# Patient Record
Sex: Male | Born: 1975
Health system: Southern US, Community
[De-identification: ages and names within clinical notes are randomized; demographics above are authoritative.]

## PROBLEM LIST (undated history)

## (undated) ENCOUNTER — Ambulatory Visit: Admission: EM | Payer: BC Managed Care – PPO | Source: Home / Self Care

## (undated) DIAGNOSIS — I10 Essential (primary) hypertension: Secondary | ICD-10-CM

## (undated) HISTORY — PX: EYE SURGERY: SHX253

---

## 2004-10-14 ENCOUNTER — Ambulatory Visit: Payer: Self-pay | Admitting: Family Medicine

## 2005-01-19 ENCOUNTER — Ambulatory Visit: Payer: Self-pay | Admitting: Family Medicine

## 2005-08-26 ENCOUNTER — Ambulatory Visit: Payer: Self-pay | Admitting: Family Medicine

## 2005-10-25 DIAGNOSIS — J309 Allergic rhinitis, unspecified: Secondary | ICD-10-CM | POA: Insufficient documentation

## 2006-03-20 ENCOUNTER — Ambulatory Visit: Payer: Self-pay | Admitting: Family Medicine

## 2006-03-20 DIAGNOSIS — K219 Gastro-esophageal reflux disease without esophagitis: Secondary | ICD-10-CM

## 2006-04-06 ENCOUNTER — Encounter: Payer: Self-pay | Admitting: Family Medicine

## 2006-04-21 ENCOUNTER — Ambulatory Visit: Payer: Self-pay | Admitting: Family Medicine

## 2006-04-21 ENCOUNTER — Telehealth (INDEPENDENT_AMBULATORY_CARE_PROVIDER_SITE_OTHER): Payer: Self-pay | Admitting: *Deleted

## 2008-06-11 ENCOUNTER — Encounter: Payer: Self-pay | Admitting: Family Medicine

## 2008-08-22 ENCOUNTER — Ambulatory Visit: Payer: Self-pay | Admitting: Family Medicine

## 2008-08-22 DIAGNOSIS — I10 Essential (primary) hypertension: Secondary | ICD-10-CM

## 2008-08-22 DIAGNOSIS — R0789 Other chest pain: Secondary | ICD-10-CM | POA: Insufficient documentation

## 2008-08-25 ENCOUNTER — Encounter: Payer: Self-pay | Admitting: Family Medicine

## 2008-08-25 LAB — CONVERTED CEMR LAB
Alkaline Phosphatase: 95 units/L (ref 39–117)
BUN: 15 mg/dL (ref 6–23)
Basophils Absolute: 0 10*3/uL (ref 0.0–0.1)
Basophils Relative: 1 % (ref 0–1)
Cholesterol: 186 mg/dL (ref 0–200)
Creatinine, Ser: 1.18 mg/dL (ref 0.40–1.50)
Eosinophils Absolute: 0.2 10*3/uL (ref 0.0–0.7)
Eosinophils Relative: 3 % (ref 0–5)
Glucose, Bld: 92 mg/dL (ref 70–99)
HDL: 36 mg/dL — ABNORMAL LOW (ref >39)
Hemoglobin: 14.7 g/dL (ref 13.0–17.0)
LDL Cholesterol: 133 mg/dL — ABNORMAL HIGH (ref 0–99)
MCHC: 33.1 g/dL (ref 30.0–36.0)
MCV: 89 fL (ref 78.0–100.0)
Monocytes Absolute: 0.6 10*3/uL (ref 0.1–1.0)
Neutro Abs: 1.9 10*3/uL (ref 1.7–7.7)
RDW: 13.8 % (ref 11.5–15.5)
Sodium: 143 meq/L (ref 135–145)
Total Bilirubin: 0.4 mg/dL (ref 0.3–1.2)
Total CHOL/HDL Ratio: 5.2
Triglycerides: 87 mg/dL (ref <150)
VLDL: 17 mg/dL (ref 0–40)

## 2009-01-23 ENCOUNTER — Telehealth (INDEPENDENT_AMBULATORY_CARE_PROVIDER_SITE_OTHER): Payer: Self-pay | Admitting: *Deleted

## 2009-01-23 ENCOUNTER — Ambulatory Visit: Payer: Self-pay | Admitting: Family Medicine

## 2009-01-23 ENCOUNTER — Telehealth: Payer: Self-pay | Admitting: Family Medicine

## 2009-01-23 DIAGNOSIS — R369 Urethral discharge, unspecified: Secondary | ICD-10-CM | POA: Insufficient documentation

## 2010-02-16 NOTE — Progress Notes (Signed)
Summary: BP med  Phone Note Call from Patient Call back at Home Phone [phone]   Caller: Patient Call For: Nani Gasser MD Summary of Call: Pt called back and is ok with adding the additional BP med. Send to [facility] in [place]. [place] Initial call taken by: Kathlene November,  January 23, 2009 11:31 AM

## 2010-02-16 NOTE — Assessment & Plan Note (Signed)
Summary: STD TEST, HTN   Vital Signs:  Patient profile:   35 year old male Weight:      291 pounds Pulse rate:   86 / minute BP sitting:   158 / 94  (left arm) Cuff size:   large  Vitals Entered By: Kathlene November (January 23, 2009 9:45 AM) CC: pt needs tested and treated for trich- partner tested positive and was treated yesterday   Primary Care Provider:  Linford Arnold, C  CC:  pt needs tested and treated for trich- partner tested positive and was treated yesterday.  History of Present Illness: pt needs tested and treated for trich- partner tested positive and was treated yesterday.  Having some irritation from urination intermittantly.  ONe had seen blood in his urine. SHe was negative for gonorrhea and chlamydia.  Has had a neg syphillis, RPR  screen this year.    Current Medications (verified): 1)  Prilosec 40 Mg Cpdr (Omeprazole) .... Take 1 Tablet By Mouth Once A Day 2)  Metoprolol Succinate 50 Mg Xr24h-Tab (Metoprolol Succinate) .... Take 1 Tablet By Mouth Once A Day in The Am.  Allergies (verified): No Known Drug Allergies  Comments:  Nurse/Medical Assistant: The patient's medications and allergies were reviewed with the patient and were updated in the Medication and Allergy Lists. Kathlene November (January 23, 2009 9:46 AM)  Physical Exam  General:  Well-developed,well-nourished,in no acute distress; alert,appropriate and cooperative throughout examination Head:  Normocephalic and atraumatic without obvious abnormalities. No apparent alopecia or balding.   Impression & Recommendations:  Problem # 1:  PENILE DISCHARGE (ICD-788.7) Will go ahead and treat for tric since he is symptomatic and we are unable to do a wet prep here in the office. I do strongly recommend GC/chlam testing as well. Cup and order slip given today for first AM urine.  Also he says he has already had HIV and syphillis testing this year.  Call if symptoms don't resolve on teh 2g of metronidazole.    Orders: T-Chlamydia & GC Probe, Urine ([id number])  Problem # 2:  HYPERTENSION, BENIGN (ICD-401.1)  Still not wel controlled. Will add ace/diuretic combon. FU in one month.  His updated medication list for this problem includes:    Metoprolol Succinate 50 Mg Xr24h-tab (Metoprolol succinate) .Marland Kitchen... Take 1 tablet by mouth once a day in the am.    Lisinopril-hydrochlorothiazide 10-12.5 Mg Tabs (Lisinopril-hydrochlorothiazide) .Marland Kitchen... Take 1 tablet by mouth once a day in the am  BP today: 158/94 Prior BP: 155/100 (08/22/2008)  Prior 10 Yr Risk Heart Disease: 7 % (08/25/2008)  Labs Reviewed: K+: 4.3 (08/22/2008) Creat: : 1.18 (08/22/2008)   Chol: 186 (08/22/2008)   HDL: 36 (08/22/2008)   LDL: 133 (08/22/2008)   TG: 87 (08/22/2008)  Complete Medication List: 1)  Prilosec 40 Mg Cpdr (Omeprazole) .... Take 1 tablet by mouth once a day 2)  Metoprolol Succinate 50 Mg Xr24h-tab (Metoprolol succinate) .... Take 1 tablet by mouth once a day in the am. 3)  Metronidazole 500 Mg Tabs (Metronidazole) .... 4 tabs by mouth x 1 4)  Lisinopril-hydrochlorothiazide 10-12.5 Mg Tabs (Lisinopril-hydrochlorothiazide) .... Take 1 tablet by mouth once a day in the am Prescriptions: LISINOPRIL-HYDROCHLOROTHIAZIDE 10-12.5 MG TABS (LISINOPRIL-HYDROCHLOROTHIAZIDE) Take 1 tablet by mouth once a day in the AM  #30 x 1   Entered and Authorized by:   Nani Gasser MD   Signed by:   Nani Gasser MD on 01/23/2009   Method used:   [facility] to  [facility] [phone][facility] (retail)       [address]       [place], [place]  [address]       Ph: [phone] or [phone]       Fax: [phone]   RxID:   [id number] METRONIDAZOLE 500 MG TABS (METRONIDAZOLE) 4 tabs by mouth x 1  #4 x 0   Entered and Authorized by:   Nani Gasser MD   Signed by:   Nani Gasser MD on 01/23/2009   Method used:   Electronically to        [facility] [phone]* (retail)       [address]       [place], [place]  [address]       Ph: [phone] or [phone]       Fax: [phone]   RxID:   [id number]

## 2010-02-16 NOTE — Progress Notes (Signed)
Summary: Med intolerance  Phone Note Call from Patient Call back at Home Phone [phone]   Caller: Patient Call For: Nani Gasser MD Summary of Call: pt called and says he threw the med up that he was prescribed this morning and told to call back if it happened Initial call taken by: Kathlene November,  January 23, 2009 2:24 PM  Follow-up for Phone Call        OK new rx sent at lower dose. just has to take a little longer.  Follow-up by: Nani Gasser MD,  January 23, 2009 3:58 PM  Additional Follow-up for Phone Call Additional follow up Details #1::        Pt notified med sent to pharmacy. Additional Follow-up by: Kathlene November,  January 23, 2009 4:02 PM    New/Updated Medications: METRONIDAZOLE 500 MG TABS (METRONIDAZOLE) Take 1 tablet by mouth two times a day for 7 days Prescriptions: METRONIDAZOLE 500 MG TABS (METRONIDAZOLE) Take 1 tablet by mouth two times a day for 7 days  #14 x 0   Entered and Authorized by:   Nani Gasser MD   Signed by:   Nani Gasser MD on 01/23/2009   Method used:   Electronically to        [facility] [phone]* (retail)       [address]       [place], [place]  [address]       Ph: [phone] or [phone]       Fax: [phone]   RxID:   [id number]

## 2010-02-16 NOTE — Progress Notes (Signed)
----   Converted from flag ---- ---- 01/23/2009 11:05 AM, Nani Gasser MD wrote: Call pt: BP was still high today. Really need to  add another medication for BP control in addition to his metoprolol. Is he OK with this. Then fu in 1 month for BP. ------------------------------  01/23/2009 @ 11:20am-Pt informed of above via VM and instructed to call back if ok and we would call into pharmacy. KJ LPN

## 2011-07-09 ENCOUNTER — Encounter (HOSPITAL_BASED_OUTPATIENT_CLINIC_OR_DEPARTMENT_OTHER): Payer: Self-pay | Admitting: *Deleted

## 2011-07-09 ENCOUNTER — Emergency Department (HOSPITAL_BASED_OUTPATIENT_CLINIC_OR_DEPARTMENT_OTHER)
Admission: EM | Admit: 2011-07-09 | Discharge: 2011-07-09 | Disposition: A | Discharge status: Home / Self Care | Payer: Self-pay | Attending: Emergency Medicine | Admitting: Emergency Medicine

## 2011-07-09 DIAGNOSIS — L02419 Cutaneous abscess of limb, unspecified: Secondary | ICD-10-CM | POA: Insufficient documentation

## 2011-07-09 DIAGNOSIS — Z23 Encounter for immunization: Secondary | ICD-10-CM | POA: Insufficient documentation

## 2011-07-09 DIAGNOSIS — L0291 Cutaneous abscess, unspecified: Secondary | ICD-10-CM

## 2011-07-09 MED ORDER — SULFAMETHOXAZOLE-TRIMETHOPRIM 800-160 MG PO TABS: 1.0000 | ORAL_TABLET | Freq: Two times a day (BID) | ORAL | Status: AC

## 2011-07-09 MED ORDER — TETANUS-DIPHTH-ACELL PERTUSSIS 5-2.5-18.5 LF-MCG/0.5 IM SUSP
0.5000 mL | Freq: Once | INTRAMUSCULAR | Status: AC
  Administered 2011-07-09: 0.5 mL via INTRAMUSCULAR
  Filled 2011-07-09: qty 0.5

## 2011-07-09 MED ORDER — CEPHALEXIN 500 MG PO CAPS: 500.0000 mg | ORAL_CAPSULE | Freq: Four times a day (QID) | ORAL | Status: AC

## 2011-07-09 MED ORDER — SULFAMETHOXAZOLE-TMP DS 800-160 MG PO TABS
1.0000 | ORAL_TABLET | Freq: Once | ORAL | Status: AC
  Administered 2011-07-09: 1 via ORAL
  Filled 2011-07-09: qty 1

## 2011-07-09 MED ORDER — LIDOCAINE HCL 2 % IJ SOLN
INTRAMUSCULAR | Status: AC
  Administered 2011-07-09: 22:00:00
  Filled 2011-07-09: qty 1

## 2011-07-09 MED ORDER — CEPHALEXIN 250 MG PO CAPS
500.0000 mg | ORAL_CAPSULE | Freq: Once | ORAL | Status: AC
  Administered 2011-07-09: 500 mg via ORAL
  Filled 2011-07-09: qty 2

## 2011-07-09 NOTE — Discharge Instructions (Signed)
Cellulitis Cellulitis is an infection of the tissue under the skin. The infected area is usually red and tender. This is caused by germs. These germs enter the body through cuts or sores. This usually happens in the arms or lower legs. HOME CARE   Take your medicine as told. Finish it even if you start to feel better.   If the infection is on the arm or leg, keep it raised (elevated).   Use a warm cloth on the infected area several times a day.   See your doctor for a follow-up visit as told.  GET HELP RIGHT AWAY IF:   You are tired or confused.   You throw up (vomit).   You have watery poop (diarrhea).   You feel ill and have muscle aches.   You have a fever.  MAKE SURE YOU:   Understand these instructions.   Will watch your condition.   Will get help right away if you are not doing well or get worse.  Document Released: 06/22/2007 Document Revised: 12/23/2010 Document Reviewed: 12/05/2008 [facility] Patient Information 2012 [place], [place].Abscess An abscess (boil or furuncle) is an infected area under your skin. This area is filled with yellowish white fluid (pus). HOME CARE   Only take medicine as told by your doctor.   Keep the skin clean around your abscess. Keep clothes that may touch the abscess clean.   Change any bandages (dressings) as told by your doctor.   Avoid direct skin contact with other people. The infection can spread by skin contact with others.   Practice good hygiene and do not share personal care items.   Do not share athletic equipment, towels, or whirlpools. Shower after every practice or work out session.   If a draining area cannot be covered:   Do not play sports.   Children should not go to daycare until the wound has healed or until fluid (drainage) stops coming out of the wound.   See your doctor for a follow-up visit as told.  GET HELP RIGHT AWAY IF:   There is more pain, puffiness (swelling), and redness in the wound site.    There is fluid or bleeding from the wound site.   You have muscle aches, chills, fever, or feel sick.   You or your child has a temperature by mouth above 102 F (38.9 C), not controlled by medicine.   Your baby is older than 3 months with a rectal temperature of 102 F (38.9 C) or higher.  MAKE SURE YOU:   Understand these instructions.   Will watch your condition.   Will get help right away if you are not doing well or get worse.  Document Released: 06/22/2007 Document Revised: 12/23/2010 Document Reviewed: 06/22/2007 [facility] Patient Information 2012 [place], [place].

## 2011-07-09 NOTE — ED Provider Notes (Signed)
History     CSN: [id number]  Arrival date & time 07/09/11  2111   First MD Initiated Contact with Patient 07/09/11 2123      Chief Complaint  Patient presents with  . Recurrent Skin Infections    (Consider location/radiation/quality/duration/timing/severity/associated sxs/prior treatment) HPI Comments: Pt states that he has been picking at the area and the area of redness has worsened  Patient is a 36 y.o. male presenting with abscess. The history is provided by the patient. No language interpreter was used.  Abscess  This is a new problem. The current episode started more than one week ago. The onset was gradual. The problem occurs continuously. The problem has been gradually worsening. The abscess is present on the left upper leg. The abscess is characterized by redness and painfulness. There were no sick contacts. He has received no recent medical care.    History reviewed. No pertinent past medical history.  Past Surgical History  Procedure Date  . Eye surgery     History reviewed. No pertinent family history.  History  Substance Use Topics  . Smoking status: Never Smoker   . Smokeless tobacco: Not on file  . Alcohol Use: No      Review of Systems  Constitutional: Negative.   Respiratory: Negative.   Cardiovascular: Negative.   Neurological: Negative.     Allergies  Review of patient's allergies indicates not on file.  Home Medications  No current outpatient prescriptions on file.  BP 150/92  Pulse 101  Temp 100.1 F (37.8 C) (Oral)  Resp 20  Ht 6' 2.5" (1.892 m)  Wt 255 lb (115.667 kg)  BMI 32.30 kg/m2  SpO2 97%  Physical Exam  Nursing note and vitals reviewed. Constitutional: He is oriented to person, place, and time. He appears well-developed and well-nourished.  Eyes: Conjunctivae are normal.  Cardiovascular: Normal rate and regular rhythm.   Pulmonary/Chest: Effort normal and breath sounds normal.  Musculoskeletal: Normal range of motion.    Neurological: He is alert and oriented to person, place, and time.  Skin:       Pt has firm area noted to the left thigh that is red and hot;pt has red area noted around to the inside of the thigh    ED Course  INCISION AND DRAINAGE Performed by: Teressa Lower Authorized by: Teressa Lower Consent: Verbal consent obtained. Written consent not obtained. Risks and benefits: risks, benefits and alternatives were discussed Consent given by: patient Patient understanding: patient states understanding of the procedure being performed Patient identity confirmed: verbally with patient Type: abscess Body area: lower extremity (left upper thigh) Anesthesia: local infiltration Local anesthetic: lidocaine 2% without epinephrine Scalpel size: 11 Incision type: single straight Complexity: simple Drainage amount: scant Wound treatment: wound left open Packing material: 1/2 in iodoform gauze Patient tolerance: Patient tolerated the procedure well with no immediate complications.   (including critical care time)  Labs Reviewed - No data to display No results found.   1. Abscess and cellulitis       MDM  Will treat as pt has a cellulitic component noted:pt educated on when to return        Teressa Lower, NP 07/09/11 2211

## 2011-07-09 NOTE — ED Notes (Signed)
Pt states he has had a boil on his inner thigh for a couple of days, "but I messed with it". Now increased pain.

## 2011-07-09 NOTE — ED Provider Notes (Signed)
Medical screening examination/treatment/procedure(s) were performed by non-physician practitioner and as supervising physician I was immediately available for consultation/collaboration.   Idil Maslanka, MD 07/09/11 2339 

## 2011-07-11 ENCOUNTER — Encounter (HOSPITAL_BASED_OUTPATIENT_CLINIC_OR_DEPARTMENT_OTHER): Payer: Self-pay | Admitting: *Deleted

## 2011-07-11 ENCOUNTER — Emergency Department (HOSPITAL_BASED_OUTPATIENT_CLINIC_OR_DEPARTMENT_OTHER)
Admission: EM | Admit: 2011-07-11 | Discharge: 2011-07-11 | Disposition: A | Discharge status: Home / Self Care | Payer: Self-pay | Attending: Emergency Medicine | Admitting: Emergency Medicine

## 2011-07-11 DIAGNOSIS — L02419 Cutaneous abscess of limb, unspecified: Secondary | ICD-10-CM | POA: Insufficient documentation

## 2011-07-11 DIAGNOSIS — Z4800 Encounter for change or removal of nonsurgical wound dressing: Secondary | ICD-10-CM | POA: Insufficient documentation

## 2011-07-11 DIAGNOSIS — L03116 Cellulitis of left lower limb: Secondary | ICD-10-CM

## 2011-07-11 MED ORDER — HYDROCODONE-ACETAMINOPHEN 5-325 MG PO TABS
2.0000 | ORAL_TABLET | Freq: Once | ORAL | Status: AC
  Administered 2011-07-11: 2 via ORAL
  Filled 2011-07-11: qty 2

## 2011-07-11 MED ORDER — HYDROCODONE-ACETAMINOPHEN 5-325 MG PO TABS: ORAL_TABLET | ORAL | Status: AC

## 2011-07-11 NOTE — Discharge Instructions (Signed)
Abscess Care After An abscess (also called a boil or furuncle) is an infected area that contains a collection of pus. Signs and symptoms of an abscess include pain, tenderness, redness, or hardness, or you may feel a moveable soft area under your skin. An abscess can occur anywhere in the body. The infection may spread to surrounding tissues causing cellulitis. A cut (incision) by the surgeon was made over your abscess and the pus was drained out. Gauze may have been packed into the space to provide a drain that will allow the cavity to heal from the inside outwards. The boil may be painful for 5 to 7 days. Most people with a boil do not have high fevers. Your abscess, if seen early, may not have localized, and may not have been lanced. If not, another appointment may be required for this if it does not get better on its own or with medications. HOME CARE INSTRUCTIONS   Only take over-the-counter or prescription medicines for pain, discomfort, or fever as directed by your caregiver.   When you bathe, soak and then remove gauze or iodoform packs at least daily or as directed by your caregiver. You may then wash the wound gently with mild soapy water. Repack with gauze or do as your caregiver directs.  SEEK IMMEDIATE MEDICAL CARE IF:   You develop increased pain, swelling, redness, drainage, or bleeding in the wound site.   You develop signs of generalized infection including muscle aches, chills, fever, or a general ill feeling.   An oral temperature above 102 F (38.9 C) develops, not controlled by medication.  See your caregiver for a recheck if you develop any of the symptoms described above. If medications (antibiotics) were prescribed, take them as directed. Document Released: 07/22/2004 Document Revised: 12/23/2010 Document Reviewed: 03/19/2007 [facility] Patient Information 2012 [place], [place].    Narcotic and benzodiazepine use may cause drowsiness, slowed breathing or dependence.  Please  use with caution and do not drive, operate machinery or watch young children alone while taking them.  Taking combinations of these medications or drinking alcohol will potentiate these effects.

## 2011-07-11 NOTE — ED Notes (Signed)
Patient here for packing removal and wound recheck.

## 2011-07-11 NOTE — ED Notes (Signed)
Dry dressing placed on left inner thigh.

## 2011-07-11 NOTE — ED Provider Notes (Signed)
History     CSN: [id number]  Arrival date & time 07/11/11  0900   First MD Initiated Contact with Patient 07/11/11 0913      Chief Complaint  Patient presents with  . Wound Check    (Consider location/radiation/quality/duration/timing/severity/associated sxs/prior treatment) HPI Comments: Pt was seen 2 days and had I&D of abscess on Saturday, it was packed.  Pt had large area of redness and swelling to thigh as well.  He reports the swelling and redness seems improved around his inner and distal thigh.  No fevers.  Has not changed dressing.  He returns for wound check.    Patient is a 36 y.o. male presenting with wound check. The history is provided by the patient, medical records and a relative.  Wound Check     History reviewed. No pertinent past medical history.  Past Surgical History  Procedure Date  . Eye surgery     History reviewed. No pertinent family history.  History  Substance Use Topics  . Smoking status: Never Smoker   . Smokeless tobacco: Not on file  . Alcohol Use: No      Review of Systems  Constitutional: Negative for fever and chills.  Skin: Positive for color change and wound.  Neurological: Negative for numbness.    Allergies  Review of patient's allergies indicates no known allergies.  Home Medications   Current Outpatient Rx  Name Route Sig Dispense Refill  . CEPHALEXIN 500 MG PO CAPS Oral Take 1 capsule (500 mg total) by mouth 4 (four) times daily. 28 capsule 0  . HYDROCODONE-ACETAMINOPHEN 5-325 MG PO TABS  1-2 tablets po q 6 hours prn moderate to severe pain 20 tablet 0  . SULFAMETHOXAZOLE-TRIMETHOPRIM 800-160 MG PO TABS Oral Take 1 tablet by mouth every 12 (twelve) hours. 14 tablet 0    BP 153/93  Pulse 85  Temp 97.8 F (36.6 C) (Oral)  Resp 20  SpO2 100%  Physical Exam  Nursing note and vitals reviewed. Constitutional: He appears well-developed and well-nourished. No distress.  Musculoskeletal:       Left hip: He exhibits  normal range of motion, normal strength and no bony tenderness.       Legs:      Diffuse area of erythema consistent with cellulitis to inner thigh and extends to distal thigh.   Skin: Skin is warm.    ED Course  Procedures (including critical care time)  Labs Reviewed - No data to display No results found.   1. Cellulitis of left thigh       MDM  I reviewed note from 2 days ago.  Some drainage still, will leave open, change dressing.  Pt is on bactrim and keflex and seems to be improving.  Will give an additional work note for today.         Gavin Pound. Oletta Lamas, MD 07/11/11 [phone]

## 2012-06-01 ENCOUNTER — Other Ambulatory Visit: Payer: Self-pay | Admitting: *Deleted

## 2012-06-01 ENCOUNTER — Other Ambulatory Visit: Payer: Self-pay | Admitting: Family Medicine

## 2012-06-01 MED ORDER — METOPROLOL SUCCINATE ER 50 MG PO TB24: 50.0000 mg | ORAL_TABLET | Freq: Every day | ORAL | Status: DC

## 2012-06-01 NOTE — Progress Notes (Signed)
Patient set up an urgent care. He did stop his blood pressure medication years ago. He was previously on metoprolol. He had been getting some headaches recently and wanted to restart his medication. We decided to send her for a weeks worth and an appointment on my schedule next week consider discharging him for an office visit. He will come in next week to reestablish care.

## 2012-06-01 NOTE — Telephone Encounter (Signed)
Resent metoprolol to [facility] in [place] instead of one in [place].

## 2013-07-05 ENCOUNTER — Ambulatory Visit (INDEPENDENT_AMBULATORY_CARE_PROVIDER_SITE_OTHER): Payer: Commercial Managed Care - PPO | Admitting: Family Medicine

## 2013-07-05 ENCOUNTER — Encounter: Payer: Self-pay | Admitting: Family Medicine

## 2013-07-05 VITALS — BP 138/85 | HR 102 | Wt 278.0 lb

## 2013-07-05 DIAGNOSIS — J0100 Acute maxillary sinusitis, unspecified: Secondary | ICD-10-CM

## 2013-07-05 DIAGNOSIS — R0789 Other chest pain: Secondary | ICD-10-CM

## 2013-07-05 DIAGNOSIS — I10 Essential (primary) hypertension: Secondary | ICD-10-CM

## 2013-07-05 DIAGNOSIS — K2100 Gastro-esophageal reflux disease with esophagitis, without bleeding: Secondary | ICD-10-CM

## 2013-07-05 MED ORDER — AMOXICILLIN-POT CLAVULANATE 875-125 MG PO TABS: 1.0000 | ORAL_TABLET | Freq: Two times a day (BID) | ORAL | Status: DC

## 2013-07-05 NOTE — Progress Notes (Signed)
Subjective:    Patient ID: Daniel Riddle, male    DOB: November 18, 1975, 38 y.o.   MRN: [id number]  HPI Sinus drainage and drainage for 2 weeks. Still having right facial pain. Some HA.  Sudafed helped some. No fever, chills or sweats.  Some cough.  No shortness of breath.  Has had chest pain on the right side of his chest for one week. 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 + radiates to his back.  Seems better with water. No shortness of breath. He does not take metoprolol for hypertension. He has cut back on spicy foods and that has helped his discomfort in the chest.  Prilosec OTC does help.  CP occurs more with activity.  Has had some indigestion in the center of his chest.  He says it hits it is very intense. Can last about 5 mintues.  Uncle with heart dz around late 2250s.  Mom had stents placed in her 2360s.  His been eating a lot of spicy food lately. No vomiting or diarrhea or bowel changes.   Review of Systems BP 138/85  Pulse 102  Wt 278 lb (126.1 kg)  SpO2 97%    No Known Allergies  No past medical history on file.  Past Surgical History  Procedure Laterality Date  . Eye surgery      History   Social History  . Marital Status: Single    Spouse Name: N/A    Number of Children: N/A  . Years of Education: N/A   Occupational History  . Not on file.   Social History Main Topics  . Smoking status: Never Smoker   . Smokeless tobacco: Not on file  . Alcohol Use: No  . Drug Use: No  . Sexual Activity:    Other Topics Concern  . Not on file   Social History Narrative  . No narrative on file    No family history on file.  Outpatient Encounter Prescriptions as of 07/05/2013  Medication Sig  . amoxicillin-clavulanate (AUGMENTIN) 875-125 MG per tablet Take 1 tablet by mouth 2 (two) times daily.  . metoprolol succinate (TOPROL-XL) 50 MG 24 hr tablet Take 1 tablet (50 mg total) by mouth daily. Take with or  immediately following a meal.          Objective:   Physical Exam  Constitutional: He is oriented to person, place, and time. He appears well-developed and well-nourished.  HENT:  Head: Normocephalic and atraumatic.  Right Ear: External ear normal.  Left Ear: External ear normal.  Nose: Nose normal.  Mouth/Throat: Oropharynx is clear and moist.  TMs and canals are clear.   Eyes: Conjunctivae and EOM are normal. Pupils are equal, round, and reactive to light.  Neck: Neck supple. No thyromegaly present.  Cardiovascular: Normal rate and normal heart sounds.   Pulmonary/Chest: Effort normal and breath sounds normal. No respiratory distress. He has no wheezes. He has no rales. He exhibits no tenderness.  Lymphadenopathy:    He has no cervical adenopathy.  Neurological: He is alert and oriented to person, place, and time.  Skin: Skin is warm and dry.  Psychiatric: He has a normal mood and affect. His behavior is normal. Judgment and thought content normal.          Assessment & Plan:  Atypical chest pain-  may be secondary to indigestion/GERD versus cardiac chest pain. He does seem to  happen more with activity and resolves after about 5 minutes of rest. Will get some additional blood work today. We'll also order treadmill stress test.  EKG today shows rate of 97 beats per minute, normal sinus rhythm with normal axis. Possible left atrial enlargement as there is a tall and widened P wave in lead 2. This is unchanged from prior in 2010. Incomplete right bundle branch block. Unchanged from previous.  GERD -  Restart Prilosec OTC since did relieve his symptoms. Reminded about reflux hygiene and handout provided.  Acute sinusitis-will treat with Augmentin. Hydrate well on the antibiotic. Call if not significantly better in one to 2 weeks.

## 2013-07-05 NOTE — Patient Instructions (Signed)
Sinusitis Sinusitis is redness, soreness, and swelling (inflammation) of the paranasal sinuses. Paranasal sinuses are air pockets within the bones of your face (beneath the eyes, the middle of the forehead, or above the eyes). In healthy paranasal sinuses, mucus is able to drain out, and air is able to circulate through them by way of your nose. However, when your paranasal sinuses are inflamed, mucus and air can become trapped. This can allow bacteria and other germs to grow and cause infection. Sinusitis can develop quickly and last only a short time (acute) or continue over a long period (chronic). Sinusitis that lasts for more than 12 weeks is considered chronic.  CAUSES  Causes of sinusitis include:  Allergies.  Structural abnormalities, such as displacement of the cartilage that separates your nostrils (deviated septum), which can decrease the air flow through your nose and sinuses and affect sinus drainage.  Functional abnormalities, such as when the small hairs (cilia) that line your sinuses and help remove mucus do not work properly or are not present. SYMPTOMS  Symptoms of acute and chronic sinusitis are the same. The primary symptoms are pain and pressure around the affected sinuses. Other symptoms include:  Upper toothache.  Earache.  Headache.  Bad breath.  Decreased sense of smell and taste.  A cough, which worsens when you are lying flat.  Fatigue.  Fever.  Thick drainage from your nose, which often is green and may contain pus (purulent).  Swelling and warmth over the affected sinuses. DIAGNOSIS  Your caregiver will perform a physical exam. During the exam, your caregiver may:  Look in your nose for signs of abnormal growths in your nostrils (nasal polyps).  Tap over the affected sinus to check for signs of infection.  View the inside of your sinuses (endoscopy) with a special imaging device with a light attached (endoscope), which is inserted into your  sinuses. If your caregiver suspects that you have chronic sinusitis, one or more of the following tests may be recommended:  Allergy tests.  Nasal culture--A sample of mucus is taken from your nose and sent to a lab and screened for bacteria.  Nasal cytology--A sample of mucus is taken from your nose and examined by your caregiver to determine if your sinusitis is related to an allergy. TREATMENT  Most cases of acute sinusitis are related to a viral infection and will resolve on their own within 10 days. Sometimes medicines are prescribed to help relieve symptoms (pain medicine, decongestants, nasal steroid sprays, or saline sprays).  However, for sinusitis related to a bacterial infection, your caregiver will prescribe antibiotic medicines. These are medicines that will help kill the bacteria causing the infection.  Rarely, sinusitis is caused by a fungal infection. In theses cases, your caregiver will prescribe antifungal medicine. For some cases of chronic sinusitis, surgery is needed. Generally, these are cases in which sinusitis recurs more than 3 times per year, despite other treatments. HOME CARE INSTRUCTIONS   Drink plenty of water. Water helps thin the mucus so your sinuses can drain more easily.  Use a humidifier.  Inhale steam 3 to 4 times a day (for example, sit in the bathroom with the shower running).  Apply a warm, moist washcloth to your face 3 to 4 times a day, or as directed by your caregiver.  Use saline nasal sprays to help moisten and clean your sinuses.  Take over-the-counter or prescription medicines for pain, discomfort, or fever only as directed by your caregiver. SEEK IMMEDIATE MEDICAL CARE IF:    You have increasing pain or severe headaches.  You have nausea, vomiting, or drowsiness.  You have swelling around your face.  You have vision problems.  You have a stiff neck.  You have difficulty breathing. MAKE SURE YOU:   Understand these  instructions.  Will watch your condition.  Will get help right away if you are not doing well or get worse. Document Released: 01/03/2005 Document Revised: 03/28/2011 Document Reviewed: 01/18/2011 Exit[employer] Patient Information 2015 [employer]. This information is not intended to replace advice given to you by your health care provider. Make sure you discuss any questions you have with your health care provider.  

## 2013-08-02 ENCOUNTER — Ambulatory Visit: Payer: Commercial Managed Care - PPO | Admitting: Family Medicine

## 2013-08-02 DIAGNOSIS — Z0289 Encounter for other administrative examinations: Secondary | ICD-10-CM

## 2014-02-17 ENCOUNTER — Encounter: Payer: Self-pay | Admitting: Family Medicine

## 2014-02-17 ENCOUNTER — Ambulatory Visit (INDEPENDENT_AMBULATORY_CARE_PROVIDER_SITE_OTHER): Payer: BLUE CROSS/BLUE SHIELD | Admitting: Family Medicine

## 2014-02-17 VITALS — BP 158/105 | HR 100 | Ht 75.0 in | Wt 290.0 lb

## 2014-02-17 DIAGNOSIS — B351 Tinea unguium: Secondary | ICD-10-CM

## 2014-02-17 DIAGNOSIS — I10 Essential (primary) hypertension: Secondary | ICD-10-CM | POA: Diagnosis not present

## 2014-02-17 DIAGNOSIS — R0789 Other chest pain: Secondary | ICD-10-CM | POA: Diagnosis not present

## 2014-02-17 MED ORDER — TAVABOROLE 5 % EX SOLN: 1.0000 "application " | Freq: Every day | CUTANEOUS | Status: DC

## 2014-02-17 MED ORDER — AMLODIPINE BESYLATE 5 MG PO TABS: 5.0000 mg | ORAL_TABLET | Freq: Every day | ORAL | Status: DC

## 2014-02-17 NOTE — Progress Notes (Signed)
   Subjective:    Patient ID: Daniel Riddle, male    DOB: 17-Sep-1975, 39 y.o.   MRN: [id number]  HPI Hypertension- Pt denies chest pain, SOB, dizziness, or heart palpitations.  Taking meds as directed w/o problems. Not currently taking medication   Has occ chest pain after eats certain foods. usually goes away on its own. Doesn't use any TUMs, etc.   He  Would also like me to look at his toenails. He thinks they have nail fungus. He's been trying to cut them to get some of the thickness off the nails.  Review of Systems     Objective:   Physical Exam  Constitutional: He is oriented to person, place, and time. He appears well-developed and well-nourished.  HENT:  Head: Normocephalic and atraumatic.  Cardiovascular: Normal rate, regular rhythm and normal heart sounds.   Pulmonary/Chest: Effort normal and breath sounds normal.  Neurological: He is alert and oriented to person, place, and time.  Skin: Skin is warm and dry.  Psychiatric: He has a normal mood and affect. His behavior is normal.          Assessment & Plan:  HTN-  Uncontrolled.  We'll start amlodipine 5 mg daily. Call if any side affects or  problems. Will check CMP and lipids.   He actually has an appointment in couple weeks for a physical so we can recheck his blood pressure at that time.  Onychomycosis -discussed options with oral Lamisil versus topical treatments.   atypical chest pain-most consistent with GERD. Recommend avoiding those foods that seem to trigger the symptoms.

## 2014-02-26 ENCOUNTER — Telehealth: Payer: Self-pay

## 2014-02-26 NOTE — Telephone Encounter (Signed)
Sent PA through cover my meds for Kerydin 5% solution. Waiting on Auth. - CF

## 2014-03-04 ENCOUNTER — Encounter: Payer: Self-pay | Admitting: Family Medicine

## 2014-03-04 ENCOUNTER — Telehealth: Payer: Self-pay | Admitting: *Deleted

## 2014-03-04 DIAGNOSIS — Z0289 Encounter for other administrative examinations: Secondary | ICD-10-CM

## 2014-03-04 NOTE — Telephone Encounter (Signed)
Received fax the Daniel Riddle was denied. Due to doesn't meet medical necessity.

## 2014-03-20 ENCOUNTER — Telehealth: Payer: Self-pay

## 2014-03-20 NOTE — Telephone Encounter (Addendum)
[facility]. Mailed a letter. Alternative medications include oral terbinafine or itraconazole also topical ciclopirox solution. - CF

## 2015-08-05 ENCOUNTER — Encounter (HOSPITAL_BASED_OUTPATIENT_CLINIC_OR_DEPARTMENT_OTHER): Payer: Self-pay | Admitting: *Deleted

## 2015-08-05 ENCOUNTER — Emergency Department (HOSPITAL_BASED_OUTPATIENT_CLINIC_OR_DEPARTMENT_OTHER)
Admission: EM | Admit: 2015-08-05 | Discharge: 2015-08-05 | Disposition: A | Discharge status: Home / Self Care | Payer: BLUE CROSS/BLUE SHIELD | Attending: Emergency Medicine | Admitting: Emergency Medicine

## 2015-08-05 DIAGNOSIS — I10 Essential (primary) hypertension: Secondary | ICD-10-CM | POA: Insufficient documentation

## 2015-08-05 DIAGNOSIS — R109 Unspecified abdominal pain: Secondary | ICD-10-CM | POA: Insufficient documentation

## 2015-08-05 HISTORY — DX: Essential (primary) hypertension: I10

## 2015-08-05 MED ORDER — NAPROXEN 500 MG PO TABS: 500.0000 mg | ORAL_TABLET | Freq: Two times a day (BID) | ORAL | Status: DC

## 2015-08-05 MED ORDER — CYCLOBENZAPRINE HCL 10 MG PO TABS: 10.0000 mg | ORAL_TABLET | Freq: Two times a day (BID) | ORAL | Status: DC | PRN

## 2015-08-05 MED FILL — CYCLOBENZAPRINE 10 MG TAB: 10 | 10 days supply | Qty: 20 | Fill #0

## 2015-08-05 MED FILL — NAPROXEN 500 MG TABLET: 500 | 15 days supply | Qty: 30 | Fill #0

## 2015-08-05 NOTE — ED Notes (Addendum)
Patient c/o left flank pain that began yesterday.  Pain is worse with movement, particularly standing up.  Patient states when the pain "catches, it locks me in position."  Patient states pain is no better or worse then when it began.  Patient states this has happened once before and the pain resolved spontaneously without intervention within 24 hours.  Patient denies hematuria and dysuria, but endorses urinary frequency for several months.  Patient denies fever and N/V.  Patient has not treated pain.  He states, "other than the pain, I feel fine."  No left CVA tenderness noted on exam.  Patient denies trauma to back or flank - no falls or heavy lifting.

## 2015-08-05 NOTE — Discharge Instructions (Signed)
Flank Pain °Flank pain is pain in your side. The flank is the area of your side between your upper belly (abdomen) and your back. Pain in this area can be caused by many different things. °HOME CARE °Home care and treatment will depend on the cause of your pain. °· Rest as told by your doctor. °· Drink enough fluids to keep your pee (urine) clear or pale yellow.   °· Only take medicine as told by your doctor. °· Tell your doctor about any changes in your pain. °· Follow up with your doctor. °GET HELP RIGHT AWAY IF:  °· Your pain does not get better with medicine.   °· You have new symptoms or your symptoms get worse. °· Your pain gets worse.   °· You have belly (abdominal) pain.   °· You are short of breath.   °· You always feel sick to your stomach (nauseous).   °· You keep throwing up (vomiting).   °· You have puffiness (swelling) in your belly.   °· You feel light-headed or you pass out (faint).   °· You have blood in your pee. °· You have a fever or lasting symptoms for more than 2-3 days. °· You have a fever and your symptoms suddenly get worse. °MAKE SURE YOU:  °· Understand these instructions. °· Will watch your condition. °· Will get help right away if you are not doing well or get worse. °  °This information is not intended to replace advice given to you by your health care provider. Make sure you discuss any questions you have with your health care provider. °  °Document Released: 10/13/2007 Document Revised: 01/24/2014 Document Reviewed: 08/18/2011 °Elsevier Interactive Patient Education ©2016 [employer] ° °

## 2015-08-05 NOTE — ED Provider Notes (Signed)
CSN: [id number]     Arrival date & time 08/05/15  1046 History   First MD Initiated Contact with Patient 08/05/15 1054     Chief Complaint  Patient presents with  . Flank Pain     (Consider location/radiation/quality/duration/timing/severity/associated sxs/prior Treatment) HPI   40 year old male with history of hypertension presenting today with complaints of left flank pain. Patient developed pressure onset of pain to his left side of back left flank area since yesterday. He described pain as a sharp with a catching sensation worsening with movement, and rates pain a 7 out of 10. Pain is not associated with fever, chills, lightheadedness, dizziness, chest pain, shortness of breath, productive cough, hemoptysis, midline back pain, abdominal pain, dysuria, hematuria, or rash. There is no associated numbness or weakness and no pain with taking deep breath. No history of kidney stones history of cancer. No specific treatment tried. 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 Report a similar episode of pain to the same location several weeks prior that lasting intermittently and resolved. Aside from mildly increased urinary frequency he denies any burning urination or blood in urine.       Past Medical History  Diagnosis Date  . Hypertension    Past Surgical History  Procedure Laterality Date  . Eye surgery     Family History  Problem Relation Age of Onset  . CAD Mother     4360s  . CAD Paternal Uncle     3350s   Social History  Substance Use Topics  . Smoking status: Never Smoker   . Smokeless tobacco: None  . Alcohol Use: No    Review of Systems  All other systems reviewed and are negative.     Allergies  Review of patient's allergies indicates no known allergies.  Home Medications   Prior to Admission medications   Medication Sig Start Date End Date  Taking? Authorizing Provider  amLODipine (NORVASC) 5 MG tablet Take 1 tablet (5 mg total) by mouth daily. 02/17/14   Agapito Gamesatherine D Metheney, MD  Tavaborole (KERYDIN) 5 % SOLN Apply 1 application topically daily. He is bringing in coupon card. 02/17/14   Agapito Gamesatherine D Metheney, MD   BP 157/120 mmHg  Pulse 82  Temp(Src) 98.8 F (37.1 C) (Oral)  Resp 19  Ht 6\' 3"  (1.905 m)  Wt 120.203 kg  BMI 33.12 kg/m2  SpO2 98% Physical Exam  Constitutional: He appears well-developed and well-nourished. No distress.  Well-appearing African American male sitting in the bed in no acute discomfort.  HENT:  Head: Atraumatic.  Eyes: Conjunctivae are normal.  Neck: Neck supple.  Cardiovascular: Normal rate and regular rhythm.   Pulmonary/Chest: Effort normal and breath sounds normal. No respiratory distress. He has no wheezes. He has no rales.  Abdominal: He exhibits no distension. There is no tenderness.  Genitourinary:  No CVA tenderness  Musculoskeletal: He exhibits tenderness (Mild tenderness noted to left parathoracic spinal muscle on palpation without any overlying skin changes. Increasing pain with lateral movement and with flexion. No significant midline spine tenderness crepitus or step-off. No overlying skin changes.).  Neurological: He is alert.  Skin: No rash noted.  Psychiatric: He has a normal mood and affect.  Nursing note and vitals reviewed.   ED Course  Procedures (including critical care time)   MDM   Final diagnoses:  Left flank pain  BP 157/120 mmHg  Pulse 82  Temp(Src) 98.8 F (37.1 C) (Oral)  Resp 19  Ht  (1.905 m)  Wt 120.203 kg  BMI 33.12 kg/m2  SpO2 98%   11:32 AM Pt here with mild reproducible L parathoracic spinal muscle at the serratus anterior segment suspicious of MSK pain.  No abd pain, doubt abdominal pathology.  Low suspicion for pyelonephritis or kidney stone.  Doubt pna or shingle.  RICE therapy discussed.  Ortho referral given.  Return precaution  given.  Pt ambulate without difficulty, no red flags.   Fayrene Helper, PA-C 08/05/15 1133  Vanetta Mulders, MD 08/06/15 [phone]

## 2016-07-12 ENCOUNTER — Emergency Department (HOSPITAL_BASED_OUTPATIENT_CLINIC_OR_DEPARTMENT_OTHER): Payer: BLUE CROSS/BLUE SHIELD

## 2016-07-12 ENCOUNTER — Encounter (HOSPITAL_BASED_OUTPATIENT_CLINIC_OR_DEPARTMENT_OTHER): Payer: Self-pay | Admitting: Emergency Medicine

## 2016-07-12 ENCOUNTER — Emergency Department (HOSPITAL_BASED_OUTPATIENT_CLINIC_OR_DEPARTMENT_OTHER)
Admission: EM | Admit: 2016-07-12 | Discharge: 2016-07-12 | Disposition: A | Discharge status: Home / Self Care | Payer: BLUE CROSS/BLUE SHIELD | Attending: Emergency Medicine | Admitting: Emergency Medicine

## 2016-07-12 DIAGNOSIS — R310 Gross hematuria: Secondary | ICD-10-CM

## 2016-07-12 DIAGNOSIS — I10 Essential (primary) hypertension: Secondary | ICD-10-CM | POA: Insufficient documentation

## 2016-07-12 LAB — BASIC METABOLIC PANEL
ANION GAP: 7 (ref 5–15)
BUN: 11 mg/dL (ref 6–20)
CHLORIDE: 107 mmol/L (ref 101–111)
CO2: 26 mmol/L (ref 22–32)
Calcium: 8.6 mg/dL — ABNORMAL LOW (ref 8.9–10.3)
Creatinine, Ser: 1.23 mg/dL (ref 0.61–1.24)
GFR calc Af Amer: >60 mL/min (ref >60)
GFR calc non Af Amer: >60 mL/min (ref >60)
GLUCOSE: 102 mg/dL — AB (ref 65–99)
POTASSIUM: 3.6 mmol/L (ref 3.5–5.1)
Sodium: 140 mmol/L (ref 135–145)

## 2016-07-12 LAB — URINALYSIS, ROUTINE W REFLEX MICROSCOPIC
BILIRUBIN URINE: NEGATIVE
GLUCOSE, UA: NEGATIVE mg/dL
Ketones, ur: 15 mg/dL — AB
Leukocytes, UA: NEGATIVE
Nitrite: NEGATIVE
PROTEIN: NEGATIVE mg/dL
Specific Gravity, Urine: 1.024 (ref 1.005–1.030)
pH: 6.5 (ref 5.0–8.0)

## 2016-07-12 LAB — CBC WITH DIFFERENTIAL/PLATELET
Basophils Absolute: 0 10*3/uL (ref 0.0–0.1)
Basophils Relative: 0 %
EOS PCT: 3 %
Eosinophils Absolute: 0.2 10*3/uL (ref 0.0–0.7)
HEMATOCRIT: 44.2 % (ref 39.0–52.0)
HEMOGLOBIN: 15.2 g/dL (ref 13.0–17.0)
LYMPHS ABS: 3 10*3/uL (ref 0.7–4.0)
LYMPHS PCT: 47 %
MCH: 30.4 pg (ref 26.0–34.0)
MCHC: 34.4 g/dL (ref 30.0–36.0)
MCV: 88.4 fL (ref 78.0–100.0)
Monocytes Absolute: 0.5 10*3/uL (ref 0.1–1.0)
Monocytes Relative: 8 %
NEUTROS ABS: 2.7 10*3/uL (ref 1.7–7.7)
Neutrophils Relative %: 42 %
PLATELETS: 189 10*3/uL (ref 150–400)
RBC: 5 MIL/uL (ref 4.22–5.81)
RDW: 13.9 % (ref 11.5–15.5)
WBC: 6.4 10*3/uL (ref 4.0–10.5)

## 2016-07-12 LAB — URINALYSIS, MICROSCOPIC (REFLEX)

## 2016-07-12 NOTE — ED Notes (Signed)
Patient transported to CT 

## 2016-07-12 NOTE — ED Triage Notes (Signed)
"   I woke up this am and I had blood in my urine " Lower back pain and nausea yesterday but none today.

## 2016-07-12 NOTE — ED Provider Notes (Signed)
[facility]   CSN: [id number] Arrival date & time: 07/12/16  0801     History   Chief Complaint No chief complaint on file.   HPI Helen HashimotoSteven L [place] is a 41 y.o. male.  Pt presents to the ED today with blood in his urine.  Pt said he had some back pain and nausea yesterday, but no pain or nausea now.  He woke up this morning and saw drops of blood when he urinated.  Slight burning.      Past Medical History:  Diagnosis Date  . Hypertension     Patient Active Problem List   Diagnosis Date Noted  . PENILE DISCHARGE 01/23/2009  . HYPERTENSION, BENIGN 08/22/2008  . CHEST PAIN, ATYPICAL 08/22/2008  . GERD 03/20/2006  . RHINITIS, ALLERGIC 10/25/2005    Past Surgical History:  Procedure Laterality Date  . EYE SURGERY         Home Medications    Prior to Admission medications   Medication Sig Start Date End Date Taking? Authorizing Provider  amLODipine (NORVASC) 5 MG tablet Take 1 tablet (5 mg total) by mouth daily. 02/17/14   Agapito GamesMetheney, Catherine D, MD  cyclobenzaprine (FLEXERIL) 10 MG tablet Take 1 tablet (10 mg total) by mouth 2 (two) times daily as needed for muscle spasms. 08/05/15   Fayrene Helperran, [place], PA-C  naproxen (NAPROSYN) 500 MG tablet Take 1 tablet (500 mg total) by mouth 2 (two) times daily. 08/05/15   Fayrene Helperran, [place], PA-C  Tavaborole (KERYDIN) 5 % SOLN Apply 1 application topically daily. He is bringing in coupon card. 02/17/14   Agapito GamesMetheney, Catherine D, MD    Family History Family History  Problem Relation Age of Onset  . CAD Mother        4860s  . CAD Paternal Uncle        3350s    Social History Social History  Substance Use Topics  . Smoking status: Never Smoker  . Smokeless tobacco: Never Used  . Alcohol use No     Allergies   Patient has no known allergies.   Review of Systems Review of Systems  Genitourinary: Positive for hematuria.  All other systems reviewed and are negative.    Physical Exam Updated Vital Signs Ht  6\' 2"  (1.88 m)   Wt 120.2 kg (265 lb)   BMI 34.02 kg/m   Physical Exam  Constitutional: He is oriented to person, place, and time. He appears well-developed and well-nourished.  HENT:  Head: Normocephalic and atraumatic.  Right Ear: External ear normal.  Left Ear: External ear normal.  Nose: Nose normal.  Mouth/Throat: Oropharynx is clear and moist.  Eyes: Conjunctivae and EOM are normal. Pupils are equal, round, and reactive to light.  Neck: Normal range of motion. Neck supple.  Cardiovascular: Normal rate, regular rhythm, normal heart sounds and intact distal pulses.   Pulmonary/Chest: Effort normal and breath sounds normal.  Abdominal: Soft. Bowel sounds are normal.  Genitourinary: Testes normal and penis normal. Circumcised. No discharge found.  Musculoskeletal: Normal range of motion.  Neurological: He is alert and oriented to person, place, and time.  Skin: Skin is warm.  Psychiatric: He has a normal mood and affect. His behavior is normal. Judgment and thought content normal.  Nursing note and vitals reviewed.    ED Treatments / Results  Labs (all labs ordered are listed, but only abnormal results are displayed) Labs Reviewed  URINALYSIS, ROUTINE W REFLEX MICROSCOPIC - Abnormal; Notable for the following:  Result Value   Hgb urine dipstick MODERATE (*)    Ketones, ur 15 (*)    All other components within normal limits  URINALYSIS, MICROSCOPIC (REFLEX) - Abnormal; Notable for the following:    Bacteria, UA RARE (*)    Squamous Epithelial / LPF 0-5 (*)    All other components within normal limits  BASIC METABOLIC PANEL - Abnormal; Notable for the following:    Glucose, Bld 102 (*)    Calcium 8.6 (*)    All other components within normal limits  CBC WITH DIFFERENTIAL/PLATELET    EKG  EKG Interpretation None       Radiology Ct Renal Stone Study  Result Date: 07/12/2016 CLINICAL DATA:  Patient states that he woke up this morning with hematuria, " Lower  back pain and nausea yesterday but none today", no other complaints EXAM: CT ABDOMEN AND PELVIS WITHOUT CONTRAST TECHNIQUE: Multidetector CT imaging of the abdomen and pelvis was performed following the standard protocol without IV contrast. COMPARISON:  None. FINDINGS: Lower chest: Clear lung bases.  Heart normal in size. Hepatobiliary: Subtle subcentimeter low-density lesion in the right liver lobe, segment 6, potentially small cyst but not well characterized on this unenhanced study. No other liver abnormality. Normal gallbladder. No bile duct dilation. Pancreas: Unremarkable. No pancreatic ductal dilatation or surrounding inflammatory changes. Spleen: Normal in size without focal abnormality. Adrenals/Urinary Tract: No adrenal masses. No renal stones. No ureteral stones. No hydronephrosis. Low-density oval mass arises from the left kidney midpole laterally measuring 2.6 cm, most likely a cyst. No other renal masses. Ureters are normal course and in caliber. Bladder is decompressed but otherwise unremarkable. Stomach/Bowel: Stomach is within normal limits. Appendix appears normal. No evidence of bowel wall thickening, distention, or inflammatory changes. Vascular/Lymphatic: No significant vascular findings are present. No enlarged abdominal or pelvic lymph nodes. Reproductive: Prostate is unremarkable. Other: No abdominal wall hernia or abnormality. No abdominopelvic ascites. Musculoskeletal: No acute or significant osseous findings. IMPRESSION: 1. No acute findings. No findings to account for the patient's hematuria or low back pain and nausea. 2. Subcentimeter lesion in the right liver lobe, nonspecific, and not characterized fell on this exam. Statistically, this is most likely a small benign lesion such as a cyst or hemangioma. Since there is no indication that this patient may be at risk for liver neoplastic disease, this can be considered benign needing no followup. This is based upon the consensus criteria  for incidental liver masses detected on CT, 2017. 3. 2.6 cm left low-density renal lesion consistent with a cyst. In the setting of hematuria, however, recommend further characterization of this lesion with renal ultrasound. Electronically Signed   By: Amie Portland M.D.   On: 07/12/2016 09:15    Procedures Procedures (including critical care time)  Medications Ordered in ED Medications - No data to display   Initial Impression / Assessment and Plan / ED Course  I have reviewed the triage vital signs and the nursing notes.  Pertinent labs & imaging results that were available during my care of the patient were reviewed by me and considered in my medical decision making (see chart for details).    Pt told of likely benign lesion on liver and cyst on kidney.  Pt encouraged to f/u with urology for possible cystoscope.  Pt knows to return if worse.   Final Clinical Impressions(s) / ED Diagnoses   Final diagnoses:  Gross hematuria    New Prescriptions New Prescriptions   No medications on file  Jacalyn Lefevre, MD 07/12/16 [phone]

## 2017-05-10 ENCOUNTER — Encounter: Payer: Self-pay | Admitting: Family Medicine

## 2017-05-10 ENCOUNTER — Ambulatory Visit (INDEPENDENT_AMBULATORY_CARE_PROVIDER_SITE_OTHER): Payer: 59 | Admitting: Family Medicine

## 2017-05-10 VITALS — BP 157/108 | HR 83 | Ht 75.0 in | Wt 272.0 lb

## 2017-05-10 DIAGNOSIS — Z13220 Encounter for screening for lipoid disorders: Secondary | ICD-10-CM

## 2017-05-10 DIAGNOSIS — R5383 Other fatigue: Secondary | ICD-10-CM

## 2017-05-10 DIAGNOSIS — I10 Essential (primary) hypertension: Secondary | ICD-10-CM | POA: Diagnosis not present

## 2017-05-10 DIAGNOSIS — B351 Tinea unguium: Secondary | ICD-10-CM | POA: Diagnosis not present

## 2017-05-10 DIAGNOSIS — N529 Male erectile dysfunction, unspecified: Secondary | ICD-10-CM | POA: Diagnosis not present

## 2017-05-10 DIAGNOSIS — M25561 Pain in right knee: Secondary | ICD-10-CM | POA: Diagnosis not present

## 2017-05-10 MED ORDER — AMLODIPINE BESYLATE 5 MG PO TABS: 5.0000 mg | ORAL_TABLET | Freq: Every day | ORAL | 0 refills | Status: DC

## 2017-05-10 MED ORDER — TERBINAFINE HCL 250 MG PO TABS: 250.0000 mg | ORAL_TABLET | Freq: Every day | ORAL | 1 refills | Status: DC

## 2017-05-10 NOTE — Progress Notes (Signed)
Subjective:    CC: knee pain, ED, toenail issue  HPI:  C/O of right knee pain - Painful wit flexion. He fell on it several months ago. He works at a [employer] and has for year.  Painful when getting out of his truck.    C/O of possible toenail fungus on both great toenails and 5th toes on both feet. He was treated with topical Kerydin in 2016 for this issue.    C/O Erectile dysfunction -feels like his habit of more difficulty with erectile dysfunction over the last year.  He also reports decreased energy and strength.  Hypertension- Pt denies chest pain, SOB, dizziness, or heart palpitations.  Not on medications.      BP (!) 157/108   Pulse 83   Ht 6\' 3"  (1.905 m)   Wt 272 lb (123.4 kg)   SpO2 100%   BMI 34.00 kg/m     No Known Allergies  Past Medical History:  Diagnosis Date  . Hypertension     Past Surgical History:  Procedure Laterality Date  . EYE SURGERY      Social History   Socioeconomic History  . Marital status: Divorced    Spouse name: Not on file  . Number of children: Not on file  . Years of education: Not on file  . Highest education level: Not on file  Occupational History  . Not on file  Social Needs  . Financial resource strain: Not on file  . Food insecurity:    Worry: Not on file    Inability: Not on file  . Transportation needs:    Medical: Not on file    Non-medical: Not on file  Tobacco Use  . Smoking status: Never Smoker  . Smokeless tobacco: Never Used  Substance and Sexual Activity  . Alcohol use: No  . Drug use: No  . Sexual activity: Not on file  Lifestyle  . Physical activity:    Days per week: Not on file    Minutes per session: Not on file  . Stress: Not on file  Relationships  . Social connections:    Talks on phone: Not on file    Gets together: Not on file    Attends religious service: Not on file    Active member of club or organization: Not on file    Attends meetings of clubs or organizations: Not on file   Relationship status: Not on file  . Intimate partner violence:    Fear of current or ex partner: Not on file    Emotionally abused: Not on file    Physically abused: Not on file    Forced sexual activity: Not on file  Other Topics Concern  . Not on file  Social History Narrative  . Not on file    Family History  Problem Relation Age of Onset  . CAD Mother        2s  . CAD Paternal Uncle        81s    Outpatient Encounter Medications as of 05/10/2017  Medication Sig  . amLODipine (NORVASC) 5 MG tablet Take 1 tablet (5 mg total) by mouth daily.  Marland Kitchen terbinafine (LAMISIL) 250 MG tablet Take 1 tablet (250 mg total) by mouth daily.  . [DISCONTINUED] amLODipine (NORVASC) 5 MG tablet Take 1 tablet (5 mg total) by mouth daily.  . [DISCONTINUED] cyclobenzaprine (FLEXERIL) 10 MG tablet Take 1 tablet (10 mg total) by mouth 2 (two) times daily as needed for muscle  spasms.  . [DISCONTINUED] naproxen (NAPROSYN) 500 MG tablet Take 1 tablet (500 mg total) by mouth 2 (two) times daily.  . [DISCONTINUED] Tavaborole (KERYDIN) 5 % SOLN Apply 1 application topically daily. He is bringing in coupon card.   No facility-administered encounter medications on file as of 05/10/2017.        Objective:    General: Well Developed, well nourished, and in no acute distress.  Neuro: Alert and oriented x3, extra-ocular muscles intact, sensation grossly intact.  HEENT: Normocephalic, atraumatic  Skin: Warm and dry, no rashes. Several toenails are thick, brown and brittle on both feet.  Cardiac: Regular rate and rhythm, no murmurs rubs or gallops, no lower extremity edema.  Respiratory: Clear to auscultation bilaterally. Not using accessory muscles, speaking in full sentences. MSK: Right knee with significant swelling.  Nontender along the joint lines around the patellar tendon.  Normal flexion and extension.  No significant crepitus.  Strength is 5 out of 5 in the hip knee and ankle.  Patellar reflex 1+  bilaterally.  Negative McMurray's.  No increased anterior laxity.   Impression and Recommendations:    HTN - Uncontrolled by not on medication.  Restart medication and f/U in 2 weeks.      ED -ED questionnaire score of 15 which is considered significant for erectile dysfunction.  I also had him complete the testosterone questionnaire and he answered yes to 4 out of the 10 questions were also screened for low testosterone.  We will check a CBC for anemia.  Explained that it could be from uncontrolled hypertension and so getting his blood pressure back under control may be helpful.  I will see him back in about 2 to 3 weeks and we can make sure that he is doing well and his blood pressures under better control and discuss treatment options for erectile dysfunction.  Right knee pain -likely Oster arthritis versus cartilage tear.  No significant laxity of the joint to suggest a ligament tear.  We will get x-ray today.  He says the last one was probably about 10 years ago or more.  We will get him scheduled with 1 of our sports med docs to have therapeutic injection.  Onychomycosis -we will treat with oral Lamisil since he has multiple nails that are affected.  Will check liver enzymes and as long as it it is normal then he can start oral Lamisil.  Will likely take 6 to 9 months for full treatment.

## 2017-05-29 ENCOUNTER — Ambulatory Visit: Payer: 59 | Admitting: Family Medicine

## 2017-05-29 ENCOUNTER — Encounter: Payer: Self-pay | Admitting: Family Medicine

## 2017-05-29 VITALS — BP 130/88 | HR 72 | Ht 75.0 in | Wt 272.0 lb

## 2017-05-29 DIAGNOSIS — N529 Male erectile dysfunction, unspecified: Secondary | ICD-10-CM | POA: Insufficient documentation

## 2017-05-29 DIAGNOSIS — I10 Essential (primary) hypertension: Secondary | ICD-10-CM | POA: Diagnosis not present

## 2017-05-29 DIAGNOSIS — R5383 Other fatigue: Secondary | ICD-10-CM

## 2017-05-29 MED ORDER — SILDENAFIL CITRATE 100 MG PO TABS: 50.0000 mg | ORAL_TABLET | Freq: Every day | ORAL | 5 refills | Status: DC | PRN

## 2017-05-29 NOTE — Progress Notes (Signed)
Subjective:    CC: Follow-up questionnaire results.  HPI:  Hypertension- restarted meds a few weeks ago.  Here for f/u. Pt denies chest pain, SOB, dizziness, or heart palpitations.  Taking meds as directed w/o problems.  Denies medication side effects.    F/U ED eval - ED questionnaire score of 15 which is considered significant for erectile dysfunction.  I also had him complete the testosterone questionnaire and he answered yes to 4 out of the 10 questions were also screened for low testosterone. He tried viagra years ago.     Past medical history, Surgical history, Family history not pertinant except as noted below, Social history, Allergies, and medications have been entered into the medical record, reviewed, and corrections made.   Review of Systems: No fevers, chills, night sweats, weight loss, chest pain, or shortness of breath.   Objective:    General: Well Developed, well nourished, and in no acute distress.  Neuro: Alert and oriented x3, extra-ocular muscles intact, sensation grossly intact.  HEENT: Normocephalic, atraumatic  Skin: Warm and dry, no rashes. Cardiac: Regular rate and rhythm, no murmurs rubs or gallops, no lower extremity edema.  Respiratory: Clear to auscultation bilaterally. Not using accessory muscles, speaking in full sentences.   Impression and Recommendations:    HTN -BP still uncontrolled today.  That he did start his medication he just did not take it this morning.  He says he still try and work on being consistent.  Is encouraged him to continue to work out and follow-up in about 4 to 6 weeks for nurse visit to recheck blood pressure.  Reminded him to go to the lab to get the labs drawn that we ordered at last office visit.  Fatigue - He marked yes to several questions on the low testosterone screening questionnaire.  Plan to repeat to check testosterone level.  If low then plan to repeat in 2 to 3 weeks with [facility] and [facility].   ED -we discussed trial of  Viagra or Cialis.  Viagra is now generic.  Also reminded him the importance of controlling blood pressure to help with any erectile dysfunction.

## 2017-05-29 NOTE — Patient Instructions (Signed)
Follow up with med in 3-4 months.

## 2017-05-30 ENCOUNTER — Telehealth: Payer: Self-pay | Admitting: Family Medicine

## 2017-05-30 LAB — CBC
HCT: 44.7 % (ref 38.5–50.0)
Hemoglobin: 15.2 g/dL (ref 13.2–17.1)
MCH: 29.6 pg (ref 27.0–33.0)
MCHC: 34 g/dL (ref 32.0–36.0)
MCV: 87.1 fL (ref 80.0–100.0)
MPV: 9.8 fL (ref 7.5–12.5)
Platelets: 213 10*3/uL (ref 140–400)
RBC: 5.13 10*6/uL (ref 4.20–5.80)
RDW: 13.8 % (ref 11.0–15.0)
WBC: 5.6 10*3/uL (ref 3.8–10.8)

## 2017-05-30 LAB — PSA: PSA: 1 ng/mL (ref <=4.0)

## 2017-05-30 LAB — LIPID PANEL
CHOLESTEROL: 188 mg/dL (ref <200)
HDL: 40 mg/dL — AB (ref >40)
LDL CHOLESTEROL (CALC): 128 mg/dL — AB
NON-HDL CHOLESTEROL (CALC): 148 mg/dL — AB (ref <130)
Total CHOL/HDL Ratio: 4.7 (calc) (ref <5.0)
Triglycerides: 95 mg/dL (ref <150)

## 2017-05-30 LAB — COMPLETE METABOLIC PANEL WITH GFR
AG Ratio: 1.2 (calc) (ref 1.0–2.5)
ALBUMIN MSPROF: 4 g/dL (ref 3.6–5.1)
ALT: 12 U/L (ref 9–46)
AST: 15 U/L (ref 10–40)
Alkaline phosphatase (APISO): 96 U/L (ref 40–115)
BUN: 16 mg/dL (ref 7–25)
CO2: 31 mmol/L (ref 20–32)
CREATININE: 1.1 mg/dL (ref 0.60–1.35)
Calcium: 9.3 mg/dL (ref 8.6–10.3)
Chloride: 105 mmol/L (ref 98–110)
GFR, EST NON AFRICAN AMERICAN: 82 mL/min/{1.73_m2} (ref >=60)
GFR, Est African American: 95 mL/min/{1.73_m2} (ref >=60)
GLOBULIN: 3.4 g/dL (ref 1.9–3.7)
Glucose, Bld: 97 mg/dL (ref 65–99)
Potassium: 4 mmol/L (ref 3.5–5.3)
Sodium: 143 mmol/L (ref 135–146)
Total Bilirubin: 0.3 mg/dL (ref 0.2–1.2)
Total Protein: 7.4 g/dL (ref 6.1–8.1)

## 2017-05-30 LAB — TSH: TSH: 1.15 mIU/L (ref 0.40–4.50)

## 2017-05-30 LAB — TESTOSTERONE: TESTOSTERONE: 346 ng/dL (ref 250–827)

## 2017-05-30 MED ORDER — TADALAFIL 20 MG PO TABS: 10.0000 mg | ORAL_TABLET | ORAL | 11 refills | Status: DC | PRN

## 2017-05-30 NOTE — Telephone Encounter (Signed)
Cialis is the preferred agent.  Medication sent to pharmacy.

## 2017-05-30 NOTE — Telephone Encounter (Signed)
Patient is aware that Cialis is preferred by his insurance to the Viagra and he is aware the Cialis has been sent to the [facility]. Patient was advised if any further information was required our office would notify him and he voices understanding.

## 2017-05-30 NOTE — Telephone Encounter (Signed)
Received a denial letter for this patients Viagra. This is a Dr. Linford Arnold patient.  The preferred product is TADALAFIL TAB . Please advise.

## 2017-06-14 ENCOUNTER — Encounter: Payer: 59 | Admitting: Family Medicine

## 2017-06-14 MED ORDER — SILDENAFIL CITRATE 20 MG PO TABS: 40.0000 mg | ORAL_TABLET | Freq: Every day | ORAL | 0 refills | Status: DC | PRN

## 2017-06-14 NOTE — Addendum Note (Signed)
Addended by: Nani Gasser D on: 06/14/2017 09:23 PM   Modules accepted: Orders

## 2017-06-14 NOTE — Telephone Encounter (Signed)
I called [facility] and [name] is now an exclusion and is no longer covered under this patients plan. Please advise if you want to appeal for Cialis.

## 2017-06-14 NOTE — Telephone Encounter (Signed)
I will send over generic sildenafil and they can run as cash. He may want to see if any coupons on [facility] that can reduce the price even more and see if affordable. O/W He is out of options. He would need to call hsi insurance to see what they recommend.

## 2017-06-15 NOTE — Telephone Encounter (Signed)
LM for pt to return call. KG LPN 

## 2017-06-20 NOTE — Telephone Encounter (Signed)
Called pt to check and see if he received vm but got no response. Vm left for him to call office with any questions. W.Ladarian Bonczek, CCMA

## 2017-07-10 ENCOUNTER — Ambulatory Visit: Payer: 59

## 2017-08-13 ENCOUNTER — Other Ambulatory Visit: Payer: Self-pay | Admitting: Family Medicine

## 2017-08-13 DIAGNOSIS — I10 Essential (primary) hypertension: Secondary | ICD-10-CM

## 2019-10-31 ENCOUNTER — Ambulatory Visit (INDEPENDENT_AMBULATORY_CARE_PROVIDER_SITE_OTHER): Payer: 59 | Admitting: Family Medicine

## 2019-10-31 ENCOUNTER — Encounter: Payer: Self-pay | Admitting: Family Medicine

## 2019-10-31 VITALS — BP 162/99 | HR 90 | Wt 288.9 lb

## 2019-10-31 DIAGNOSIS — B351 Tinea unguium: Secondary | ICD-10-CM | POA: Insufficient documentation

## 2019-10-31 MED ORDER — TERBINAFINE HCL 250 MG PO TABS: 250.0000 mg | ORAL_TABLET | Freq: Every day | ORAL | 0 refills | Status: DC

## 2019-10-31 NOTE — Progress Notes (Signed)
Daniel Riddle - 44 y.o. male MRN [id number]  Date of birth: February 09, 1975  Subjective Chief Complaint  Patient presents with  . Nail Problem    HPI Daniel Riddle is a 44 y.o. male here today with complaint of toenail issue.  He reports thickened, brittle nails on great toes and second toes of both feet.  They have been this way for several months.  He was prescribed terbinafine in the past but insurance did not cover.  He would like to try this at this time.   ROS:  A comprehensive ROS was completed and negative except as noted per HPI  No Known Allergies  Past Medical History:  Diagnosis Date  . Hypertension     Past Surgical History:  Procedure Laterality Date  . EYE SURGERY      Social History   Socioeconomic History  . Marital status: Divorced    Spouse name: Not on file  . Number of children: Not on file  . Years of education: Not on file  . Highest education level: Not on file  Occupational History  . Not on file  Tobacco Use  . Smoking status: Never Smoker  . Smokeless tobacco: Never Used  Substance and Sexual Activity  . Alcohol use: No  . Drug use: No  . Sexual activity: Not on file  Other Topics Concern  . Not on file  Social History Narrative  . Not on file   Social Determinants of Health   Financial Resource Strain:   . Difficulty of Paying Living Expenses: Not on file  Food Insecurity:   . Worried About Programme researcher, broadcasting/film/video in the Last Year: Not on file  . Ran Out of Food in the Last Year: Not on file  Transportation Needs:   . Lack of Transportation (Medical): Not on file  . Lack of Transportation (Non-Medical): Not on file  Physical Activity:   . Days of Exercise per Week: Not on file  . Minutes of Exercise per Session: Not on file  Stress:   . Feeling of Stress : Not on file  Social Connections:   . Frequency of Communication with Friends and Family: Not on file  . Frequency of Social Gatherings with Friends and Family: Not on file  .  Attends Religious Services: Not on file  . Active Member of Clubs or Organizations: Not on file  . Attends Banker Meetings: Not on file  . Marital Status: Not on file    Family History  Problem Relation Age of Onset  . CAD Mother        78s  . CAD Paternal Uncle        7s    Health Maintenance  Topic Date Due  . Hepatitis C Screening  Never done  . COVID-19 Vaccine (1) Never done  . HIV Screening  Never done  . INFLUENZA VACCINE  Never done  . TETANUS/TDAP  07/08/2021     ----------------------------------------------------------------------------------------------------------------------------------------------------------------------------------------------------------------- Physical Exam BP (!) 162/99 (BP Location: Left Arm, Patient Position: Sitting, Cuff Size: Large)   Pulse 90   Wt 288 lb 14.4 oz (131 kg)   SpO2 98%   BMI 36.11 kg/m   Physical Exam Constitutional:      Appearance: Normal appearance.  Eyes:     General: No scleral icterus. Skin:    Comments: Thickened dystrophic nails on bilateral great and second toes.  Mild yellowing of nail.    Neurological:     Mental Status: He  is alert.     ------------------------------------------------------------------------------------------------------------------------------------------------------------------------------------------------------------------- Assessment and Plan  Onychomycosis Check baseline LFT's Start terbinafine 250mg  daily x12 weeks.  Recheck LFT's at 6 week mark.     Meds ordered this encounter  Medications  . terbinafine (LAMISIL) 250 MG tablet    Sig: Take 1 tablet (250 mg total) by mouth daily.    Dispense:  84 tablet    Refill:  0    No follow-ups on file.    This visit occurred during the SARS-CoV-2 public health emergency.  Safety protocols were in place, including screening questions prior to the visit, additional usage of staff PPE, and extensive cleaning  of exam room while observing appropriate contact time as indicated for disinfecting solutions.

## 2019-10-31 NOTE — Patient Instructions (Signed)
Fungal Nail Infection A fungal nail infection is a common infection of the toenails or fingernails. This condition affects toenails more often than fingernails. It often affects the great, or big, toes. More than one nail may be infected. The condition can be passed from person to person (is contagious). What are the causes? This condition is caused by a fungus. Several types of fungi can cause the infection. These fungi are common in moist and warm areas. If your hands or feet come into contact with the fungus, it may get into a crack in your fingernail or toenail and cause the infection. What increases the risk? The following factors may make you more likely to develop this condition:  Being male.  Being of older age.  Living with someone who has the fungus.  Walking barefoot in areas where the fungus thrives, such as showers or locker rooms.  Wearing shoes and socks that cause your feet to sweat.  Having a nail injury or a recent nail surgery.  Having certain medical conditions, such as: ? Athlete's foot. ? Diabetes. ? Psoriasis. ? Poor circulation. ? A weak body defense system (immune system). What are the signs or symptoms? Symptoms of this condition include:  A pale spot on the nail.  Thickening of the nail.  A nail that becomes yellow or brown.  A brittle or ragged nail edge.  A crumbling nail.  A nail that has lifted away from the nail bed. How is this diagnosed? This condition is diagnosed with a physical exam. Your health care provider may take a scraping or clipping from your nail to test for the fungus. How is this treated? Treatment is not needed for mild infections. If you have significant nail changes, treatment may include:  Antifungal medicines taken by mouth (orally). You may need to take the medicine for several weeks or several months, and you may not see the results for a long time. These medicines can cause side effects. Ask your health care provider  what problems to watch for.  Antifungal nail polish or nail cream. These may be used along with oral antifungal medicines.  Laser treatment of the nail.  Surgery to remove the nail. This may be needed for the most severe infections. It can take a long time, usually up to a year, for the infection to go away. The infection may also come back. Follow these instructions at home: Medicines  Take or apply over-the-counter and prescription medicines only as told by your health care provider.  Ask your health care provider about using over-the-counter mentholated ointment on your nails. Nail care  Trim your nails often.  Wash and dry your hands and feet every day.  Keep your feet dry: ? Wear absorbent socks, and change your socks frequently. ? Wear shoes that allow air to circulate, such as sandals or canvas tennis shoes. Throw out old shoes.  Do not use artificial nails.  If you go to a nail salon, make sure you choose one that uses clean instruments.  Use antifungal foot powder on your feet and in your shoes. General instructions  Do not share personal items, such as towels or nail clippers.  Do not walk barefoot in shower rooms or locker rooms.  Wear rubber gloves if you are working with your hands in wet areas.  Keep all follow-up visits as told by your health care provider. This is important. Contact a health care provider if: Your infection is not getting better or it is getting worse   after several months. Summary  A fungal nail infection is a common infection of the toenails or fingernails.  Treatment is not needed for mild infections. If you have significant nail changes, treatment may include taking medicine orally and applying medicine to your nails.  It can take a long time, usually up to a year, for the infection to go away. The infection may also come back.  Take or apply over-the-counter and prescription medicines only as told by your health care  provider.  Follow instructions for taking care of your nails to help prevent infection from coming back or spreading. This information is not intended to replace advice given to you by your health care provider. Make sure you discuss any questions you have with your health care provider. Document Revised: 04/26/2018 Document Reviewed: 06/09/2017 Elsevier Patient Education  2020 [employer]  

## 2019-10-31 NOTE — Assessment & Plan Note (Signed)
Check baseline LFT's Start terbinafine 250mg  daily x12 weeks.  Recheck LFT's at 6 week mark.

## 2019-11-01 ENCOUNTER — Other Ambulatory Visit: Payer: Self-pay

## 2019-11-01 DIAGNOSIS — B351 Tinea unguium: Secondary | ICD-10-CM

## 2019-11-01 LAB — HEPATIC FUNCTION PANEL
AG Ratio: 1.2 (calc) (ref 1.0–2.5)
ALT: 15 U/L (ref 9–46)
AST: 17 U/L (ref 10–40)
Albumin: 3.8 g/dL (ref 3.6–5.1)
Alkaline phosphatase (APISO): 78 U/L (ref 36–130)
Bilirubin, Direct: 0.1 mg/dL (ref 0.0–0.2)
Globulin: 3.2 g/dL (calc) (ref 1.9–3.7)
Indirect Bilirubin: 0.2 mg/dL (calc) (ref 0.2–1.2)
Total Bilirubin: 0.3 mg/dL (ref 0.2–1.2)
Total Protein: 7 g/dL (ref 6.1–8.1)

## 2019-12-11 ENCOUNTER — Ambulatory Visit: Payer: 59 | Admitting: Family Medicine

## 2020-04-10 ENCOUNTER — Other Ambulatory Visit: Payer: Self-pay

## 2020-04-10 ENCOUNTER — Ambulatory Visit (INDEPENDENT_AMBULATORY_CARE_PROVIDER_SITE_OTHER): Payer: 59 | Admitting: Family Medicine

## 2020-04-10 ENCOUNTER — Encounter: Payer: Self-pay | Admitting: Family Medicine

## 2020-04-10 VITALS — BP 160/105 | HR 86 | Ht 75.0 in | Wt 291.0 lb

## 2020-04-10 DIAGNOSIS — M79672 Pain in left foot: Secondary | ICD-10-CM | POA: Diagnosis not present

## 2020-04-10 DIAGNOSIS — I10 Essential (primary) hypertension: Secondary | ICD-10-CM | POA: Diagnosis not present

## 2020-04-10 MED ORDER — AMLODIPINE BESYLATE 5 MG PO TABS: ORAL_TABLET | ORAL | 0 refills | Status: DC

## 2020-04-10 NOTE — Progress Notes (Signed)
Acute Office Visit  Subjective:    Patient ID: Daniel Riddle, male    DOB: 02-17-1975, 45 y.o.   MRN: [id number]  Chief Complaint  Patient presents with  . Foot Problem    HPI Patient is in today for left foot problem.  About a week ago his left foot got wrapped up in some cables at work. He reported it was extremely painful and swollen to the point where he could not fit his shoe on his foot. Reports it was very bruised the first few days. He has been trying to keep it elevated and resting as able. He now reports the swelling is still there but significantly improved, bruising has resolved, skin is intact, and he has full range of motion and normal weight bearing without pain. His primary concern is there is still some mild 1/10 discomfort over the dorsolateral aspect of the foot when pressure is applied either by shoes or palpation.   He denies any skin changes, loss of sensation or range of motion, or interference with activities of daily living. Normal balance/gait, weight bearing, and full range of motion with no increase in pain.   Past Medical History:  Diagnosis Date  . Hypertension     Past Surgical History:  Procedure Laterality Date  . EYE SURGERY      Family History  Problem Relation Age of Onset  . CAD Mother        31s  . CAD Paternal Uncle        86s    Social History   Socioeconomic History  . Marital status: Divorced    Spouse name: Not on file  . Number of children: Not on file  . Years of education: Not on file  . Highest education level: Not on file  Occupational History  . Not on file  Tobacco Use  . Smoking status: Never Smoker  . Smokeless tobacco: Never Used  Substance and Sexual Activity  . Alcohol use: No  . Drug use: No  . Sexual activity: Not on file  Other Topics Concern  . Not on file  Social History Narrative  . Not on file   Social Determinants of Health   Financial Resource Strain: Not on file  Food Insecurity: Not on  file  Transportation Needs: Not on file  Physical Activity: Not on file  Stress: Not on file  Social Connections: Not on file  Intimate Partner Violence: Not on file    Outpatient Medications Prior to Visit  Medication Sig Dispense Refill  . amLODipine (NORVASC) 5 MG tablet TAKE 1 TABLET(5 MG) BY MOUTH DAILY 90 tablet 0  . sildenafil (REVATIO) 20 MG tablet Take 2-5 tablets (40-100 mg total) by mouth daily as needed. 80 tablet 0  . terbinafine (LAMISIL) 250 MG tablet Take 1 tablet (250 mg total) by mouth daily. 84 tablet 0   No facility-administered medications prior to visit.    No Known Allergies  Review of Systems All review of systems negative except what is listed in the HPI     Objective:    Physical Exam Cardiovascular:     Pulses:          Dorsalis pedis pulses are 2+ on the left side.  Musculoskeletal:     Left foot: Normal range of motion. No deformity.       Feet:  Feet:     Left foot:     Skin integrity: Skin integrity normal. No skin breakdown or erythema.  Comments: Full range of motion and weight bearing without any increase in pain; full sensation to dorsal and plantar aspects of foot, no palpable masses or deformities, generalized mild non-pitting swelling to left foot    There were no vitals taken for this visit. Wt Readings from Last 3 Encounters:  10/31/19 288 lb 14.4 oz (131 kg)  05/29/17 272 lb (123.4 kg)  05/10/17 272 lb (123.4 kg)    Health Maintenance Due  Topic Date Due  . Hepatitis C Screening  Never done  . COVID-19 Vaccine (1) Never done  . HIV Screening  Never done  . COLONOSCOPY (Pts 45-95yrs Insurance coverage will need to be confirmed)  Never done  . INFLUENZA VACCINE  Never done    There are no preventive care reminders to display for this patient.   Lab Results  Component Value Date   TSH 1.15 05/29/2017   Lab Results  Component Value Date   WBC 5.6 05/29/2017   HGB 15.2 05/29/2017   HCT 44.7 05/29/2017   MCV  87.1 05/29/2017   PLT 213 05/29/2017   Lab Results  Component Value Date   NA 143 05/29/2017   K 4.0 05/29/2017   CO2 31 05/29/2017   GLUCOSE 97 05/29/2017   BUN 16 05/29/2017   CREATININE 1.10 05/29/2017   BILITOT 0.3 10/31/2019   ALKPHOS 95 08/22/2008   AST 17 10/31/2019   ALT 15 10/31/2019   PROT 7.0 10/31/2019   ALBUMIN 4.0 08/22/2008   CALCIUM 9.3 05/29/2017   ANIONGAP 7 07/12/2016   Lab Results  Component Value Date   CHOL 188 05/29/2017   Lab Results  Component Value Date   HDL 40 (L) 05/29/2017   Lab Results  Component Value Date   LDLCALC 128 (H) 05/29/2017   Lab Results  Component Value Date   TRIG 95 05/29/2017   Lab Results  Component Value Date   CHOLHDL 4.7 05/29/2017   No results found for: HGBA1C     Assessment & Plan:   1. Foot pain, left  Given presentation and significant improvement in symptoms over the past week, fracture unlikely. Encouraged patient to continue working decreasing swelling by ice, elevation. Can do occasional ibuprofen for pain, would prefer he not take much given his elevated BP today and no recent labs. Educated on signs and symptoms requiring further evaluation. I'm expecting symptoms to improve as swelling resolves. Follow-up if no improvement or if symptoms worsen.  2. HYPERTENSION, BENIGN  BP uncontrolled at this visit and last visit a few months ago in the office. Patient reports that he stopped taking his medication about a year ago because he felt like he was improving lifestyle with diet and exercise. He has not been checking it regularly, but knows it has been high. Recommend restarting amlodipine now and monitoring BP at home. Given that he has not a CMP in awhile, he is asking about taking antiinflammatories, and recent terbinafine use will go ahead and check a CMP.  - amLODipine (NORVASC) 5 MG tablet; TAKE 1 TABLET(5 MG) BY MOUTH DAILY  Dispense: 90 tablet; Refill: 0 - COMPLETE METABOLIC PANEL WITH  GFR   Follow-up in about 3 weeks for BP and foot re-evaluation or sooner if needed.    Clayborne Dana, NP

## 2020-04-11 LAB — COMPLETE METABOLIC PANEL WITH GFR
AG Ratio: 1.1 (calc) (ref 1.0–2.5)
ALT: 18 U/L (ref 9–46)
AST: 18 U/L (ref 10–40)
Albumin: 3.8 g/dL (ref 3.6–5.1)
Alkaline phosphatase (APISO): 87 U/L (ref 36–130)
BUN: 18 mg/dL (ref 7–25)
CO2: 26 mmol/L (ref 20–32)
Calcium: 8.9 mg/dL (ref 8.6–10.3)
Chloride: 106 mmol/L (ref 98–110)
Creat: 1.22 mg/dL (ref 0.60–1.35)
GFR, Est African American: 82 mL/min/{1.73_m2} (ref >=60)
GFR, Est Non African American: 71 mL/min/{1.73_m2} (ref >=60)
Globulin: 3.4 g/dL (calc) (ref 1.9–3.7)
Glucose, Bld: 117 mg/dL (ref 65–139)
Potassium: 3.7 mmol/L (ref 3.5–5.3)
Sodium: 142 mmol/L (ref 135–146)
Total Bilirubin: 0.4 mg/dL (ref 0.2–1.2)
Total Protein: 7.2 g/dL (ref 6.1–8.1)

## 2020-05-04 ENCOUNTER — Other Ambulatory Visit: Payer: Self-pay

## 2020-05-04 ENCOUNTER — Ambulatory Visit (INDEPENDENT_AMBULATORY_CARE_PROVIDER_SITE_OTHER): Payer: 59 | Admitting: Family Medicine

## 2020-05-04 ENCOUNTER — Encounter: Payer: Self-pay | Admitting: Family Medicine

## 2020-05-04 VITALS — BP 160/99 | HR 88 | Temp 98.4°F | Resp 16

## 2020-05-04 DIAGNOSIS — I10 Essential (primary) hypertension: Secondary | ICD-10-CM

## 2020-05-04 DIAGNOSIS — M79672 Pain in left foot: Secondary | ICD-10-CM

## 2020-05-04 NOTE — Patient Instructions (Signed)

## 2020-05-04 NOTE — Progress Notes (Signed)
Acute Office Visit  Subjective:    Patient ID: Daniel Riddle, male    DOB: 1975-03-27, 45 y.o.   MRN: [id number]  Chief Complaint  Patient presents with  . Hypertension    HPI Patient is in today for foot pain and BP follow-up.  Foot pain: Sprain like symptoms of left foot at last visit. Reports he feels 90% back to normal, full range of motion, no pain unless he stretches it too hard. Thinks his shoes aren't helping either - just got a new pair of work boots to try, also considering getting some inserts. No loss of function, sensation, edema, bruising. Feeling much better overall. No new concerns.   Blood pressure: At last visit, patient was restarted on his amlodipine for HTN. States he never picked it up from the pharmacy and did not check his BP at home at all. States he has been trying to be a little more active and eat healthier - avoiding red meats, fried foods, and trying to eat more fruits/vegetables and drinking more water. He denies any headaches, vision changes, edema, shortness of breath, chest pain, dizziness, lightheadedness.   Past Medical History:  Diagnosis Date  . Hypertension     Past Surgical History:  Procedure Laterality Date  . EYE SURGERY      Family History  Problem Relation Age of Onset  . CAD Mother        15s  . CAD Paternal Uncle        24s    Social History   Socioeconomic History  . Marital status: Divorced    Spouse name: Not on file  . Number of children: Not on file  . Years of education: Not on file  . Highest education level: Not on file  Occupational History  . Not on file  Tobacco Use  . Smoking status: Never Smoker  . Smokeless tobacco: Never Used  Substance and Sexual Activity  . Alcohol use: No  . Drug use: No  . Sexual activity: Not on file  Other Topics Concern  . Not on file  Social History Narrative  . Not on file   Social Determinants of Health   Financial Resource Strain: Not on file  Food Insecurity:  Not on file  Transportation Needs: Not on file  Physical Activity: Not on file  Stress: Not on file  Social Connections: Not on file  Intimate Partner Violence: Not on file    Outpatient Medications Prior to Visit  Medication Sig Dispense Refill  . amLODipine (NORVASC) 5 MG tablet TAKE 1 TABLET(5 MG) BY MOUTH DAILY 90 tablet 0  . terbinafine (LAMISIL) 250 MG tablet Take 1 tablet (250 mg total) by mouth daily. 84 tablet 0   No facility-administered medications prior to visit.    No Known Allergies  Review of Systems All review of systems negative except what is listed in the HPI     Objective:    Physical Exam Constitutional:      Appearance: Normal appearance.  HENT:     Head: Normocephalic and atraumatic.  Cardiovascular:     Rate and Rhythm: Normal rate and regular rhythm.     Pulses: Normal pulses.  Pulmonary:     Effort: Pulmonary effort is normal.     Breath sounds: Normal breath sounds.  Musculoskeletal:        General: No swelling or tenderness. Normal range of motion.  Skin:    General: Skin is warm and dry.  Capillary Refill: Capillary refill takes less than 2 seconds.     Findings: No lesion or rash.  Neurological:     General: No focal deficit present.     Mental Status: He is alert and oriented to person, place, and time. Mental status is at baseline.  Psychiatric:        Mood and Affect: Mood normal.        Behavior: Behavior normal.        Thought Content: Thought content normal.        Judgment: Judgment normal.     BP (!) 160/99   Pulse 88   Temp 98.4 F (36.9 C)   Resp 16   SpO2 97%  Wt Readings from Last 3 Encounters:  04/10/20 291 lb (132 kg)  10/31/19 288 lb 14.4 oz (131 kg)  05/29/17 272 lb (123.4 kg)    Health Maintenance Due  Topic Date Due  . Hepatitis C Screening  Never done  . HIV Screening  Never done  . COLONOSCOPY (Pts 45-83yrs Insurance coverage will need to be confirmed)  Never done  . COVID-19 Vaccine (3 - Booster  for Pfizer series) 04/06/2020    There are no preventive care reminders to display for this patient.   Lab Results  Component Value Date   TSH 1.15 05/29/2017   Lab Results  Component Value Date   WBC 5.6 05/29/2017   HGB 15.2 05/29/2017   HCT 44.7 05/29/2017   MCV 87.1 05/29/2017   PLT 213 05/29/2017   Lab Results  Component Value Date   NA 142 04/10/2020   K 3.7 04/10/2020   CO2 26 04/10/2020   GLUCOSE 117 04/10/2020   BUN 18 04/10/2020   CREATININE 1.22 04/10/2020   BILITOT 0.4 04/10/2020   ALKPHOS 95 08/22/2008   AST 18 04/10/2020   ALT 18 04/10/2020   PROT 7.2 04/10/2020   ALBUMIN 4.0 08/22/2008   CALCIUM 8.9 04/10/2020   ANIONGAP 7 07/12/2016   Lab Results  Component Value Date   CHOL 188 05/29/2017   Lab Results  Component Value Date   HDL 40 (L) 05/29/2017   Lab Results  Component Value Date   LDLCALC 128 (H) 05/29/2017   Lab Results  Component Value Date   TRIG 95 05/29/2017   Lab Results  Component Value Date   CHOLHDL 4.7 05/29/2017   No results found for: HGBA1C     Assessment & Plan:   1. HYPERTENSION, BENIGN BP remains elevated today, asymptomatic. He agreed to pick up his prescription from last visit and start taking the amlodipine daily. Educated on [employer] and handout provided. Recommend f/u in 4-5 weeks to recheck BP and monitor for any adverse reactions to medication.  2. Foot pain, left Mostly resolved. Continue stretches and exercises we discussed. Follow-up if needed.    Lollie Marrow Reola Calkins, DNP, FNP-C

## 2020-06-01 ENCOUNTER — Ambulatory Visit: Payer: 59 | Admitting: Family Medicine

## 2020-08-18 ENCOUNTER — Ambulatory Visit: Payer: 59 | Admitting: Family Medicine

## 2020-10-09 ENCOUNTER — Ambulatory Visit (INDEPENDENT_AMBULATORY_CARE_PROVIDER_SITE_OTHER): Payer: 59 | Admitting: Family Medicine

## 2020-10-09 ENCOUNTER — Other Ambulatory Visit: Payer: Self-pay

## 2020-10-09 ENCOUNTER — Encounter: Payer: Self-pay | Admitting: Family Medicine

## 2020-10-09 VITALS — BP 149/96 | HR 88 | Ht 75.0 in | Wt 286.0 lb

## 2020-10-09 DIAGNOSIS — R0683 Snoring: Secondary | ICD-10-CM | POA: Diagnosis not present

## 2020-10-09 DIAGNOSIS — I10 Essential (primary) hypertension: Secondary | ICD-10-CM | POA: Diagnosis not present

## 2020-10-09 DIAGNOSIS — R7309 Other abnormal glucose: Secondary | ICD-10-CM | POA: Diagnosis not present

## 2020-10-09 DIAGNOSIS — Z13220 Encounter for screening for lipoid disorders: Secondary | ICD-10-CM | POA: Diagnosis not present

## 2020-10-09 DIAGNOSIS — Z1211 Encounter for screening for malignant neoplasm of colon: Secondary | ICD-10-CM

## 2020-10-09 MED ORDER — AMLODIPINE BESYLATE 5 MG PO TABS
ORAL_TABLET | ORAL | 0 refills | Status: DC
Start: 2020-10-09 — End: 2021-08-10

## 2020-10-09 NOTE — Assessment & Plan Note (Signed)
STOP BANG score of 7/8. Discussed testing options. He would prefer in labs study.  Discussed risk of having untreated sleep apnea.  No known family history.

## 2020-10-09 NOTE — Assessment & Plan Note (Signed)
Uncontrolled but has been out of medication.  New RF sent to pharmacy.

## 2020-10-09 NOTE — Progress Notes (Signed)
Established Patient Office Visit  Subjective:  Patient ID: Daniel Riddle, male    DOB: August 21, 1975  Age: 45 y.o. MRN: [id number]  CC:  Chief Complaint  Patient presents with   Snoring    HPI Daniel Riddle presents for snoring.  He reports that his girlfriend says that he snores excessively he pauses his breathing.  Occasionally he can get in a position with the snoring is not as bad.  She is concerned and he is concerned that he may have sleep apnea.  No prior evaluation or work-up.  No family history.  He does feel tired during the day.  Hypertension-he says he did not take his medications today.  Based on his med refills it looks like he has been out of medication for a while.  Past Medical History:  Diagnosis Date   Hypertension     Past Surgical History:  Procedure Laterality Date   EYE SURGERY      Family History  Problem Relation Age of Onset   CAD Mother        62s   CAD Paternal Uncle        33s    Social History   Socioeconomic History   Marital status: Divorced    Spouse name: Not on file   Number of children: Not on file   Years of education: Not on file   Highest education level: Not on file  Occupational History   Not on file  Tobacco Use   Smoking status: Never   Smokeless tobacco: Never  Substance and Sexual Activity   Alcohol use: No   Drug use: No   Sexual activity: Not on file  Other Topics Concern   Not on file  Social History Narrative   Not on file   Social Determinants of Health   Financial Resource Strain: Not on file  Food Insecurity: Not on file  Transportation Needs: Not on file  Physical Activity: Not on file  Stress: Not on file  Social Connections: Not on file  Intimate Partner Violence: Not on file    Outpatient Medications Prior to Visit  Medication Sig Dispense Refill   terbinafine (LAMISIL) 250 MG tablet Take 1 tablet (250 mg total) by mouth daily. 84 tablet 0   amLODipine (NORVASC) 5 MG tablet TAKE 1 TABLET(5  MG) BY MOUTH DAILY 90 tablet 0   No facility-administered medications prior to visit.    No Known Allergies  ROS Review of Systems    Objective:    Physical Exam Vitals reviewed.  Constitutional:      Appearance: Normal appearance. He is well-developed.  HENT:     Head: Normocephalic and atraumatic.  Eyes:     Conjunctiva/sclera: Conjunctivae normal.  Pulmonary:     Effort: Pulmonary effort is normal.  Skin:    General: Skin is warm and dry.     Coloration: Skin is not pale.  Neurological:     Mental Status: He is alert and oriented to person, place, and time. Mental status is at baseline.  Psychiatric:        Behavior: Behavior normal.    BP (!) 149/96   Pulse 88   Ht 6\' 3"  (1.905 m)   Wt 286 lb (129.7 kg)   SpO2 98%   BMI 35.75 kg/m  Wt Readings from Last 3 Encounters:  10/09/20 286 lb (129.7 kg)  04/10/20 291 lb (132 kg)  10/31/19 288 lb 14.4 oz (131 kg)     Health  Maintenance Due  Topic Date Due   HIV Screening  Never done   Hepatitis C Screening  Never done   COLONOSCOPY (Pts 45-25yrs Insurance coverage will need to be confirmed)  Never done    There are no preventive care reminders to display for this patient.  Lab Results  Component Value Date   TSH 1.15 05/29/2017   Lab Results  Component Value Date   WBC 5.6 05/29/2017   HGB 15.2 05/29/2017   HCT 44.7 05/29/2017   MCV 87.1 05/29/2017   PLT 213 05/29/2017   Lab Results  Component Value Date   NA 142 04/10/2020   K 3.7 04/10/2020   CO2 26 04/10/2020   GLUCOSE 117 04/10/2020   BUN 18 04/10/2020   CREATININE 1.22 04/10/2020   BILITOT 0.4 04/10/2020   ALKPHOS 95 08/22/2008   AST 18 04/10/2020   ALT 18 04/10/2020   PROT 7.2 04/10/2020   ALBUMIN 4.0 08/22/2008   CALCIUM 8.9 04/10/2020   ANIONGAP 7 07/12/2016   Lab Results  Component Value Date   CHOL 188 05/29/2017   Lab Results  Component Value Date   HDL 40 (L) 05/29/2017   Lab Results  Component Value Date   LDLCALC  128 (H) 05/29/2017   Lab Results  Component Value Date   TRIG 95 05/29/2017   Lab Results  Component Value Date   CHOLHDL 4.7 05/29/2017   No results found for: HGBA1C    Assessment & Plan:   Problem List Items Addressed This Visit       Cardiovascular and Mediastinum   HYPERTENSION, BENIGN - Primary    Uncontrolled but has been out of medication.  New RF sent to pharmacy.       Relevant Medications   amLODipine (NORVASC) 5 MG tablet     Other   Snoring    STOP BANG score of 7/8. Discussed testing options. He would prefer in labs study.  Discussed risk of having untreated sleep apnea.  No known family history.      Relevant Orders   Split night study   Other Visit Diagnoses     Abnormal glucose       Screening, lipid       Screening for colon cancer       Relevant Orders   Ambulatory referral to Gastroenterology       We did discuss scheduling a physical in the next couple months and getting some up-to-date blood work.  He is also noticed some nocturia and wants to discuss that it is his follow-up as well.  Would also like to get a hemoglobin A1c when I see him back for blood work because of elevated glucose levels.  Meds ordered this encounter  Medications   amLODipine (NORVASC) 5 MG tablet    Sig: TAKE 1 TABLET(5 MG) BY MOUTH DAILY    Dispense:  90 tablet    Refill:  0    Follow-up: Return in about 6 weeks (around 11/20/2020) for CPE.    Nani Gasser, MD

## 2020-11-04 ENCOUNTER — Encounter: Payer: Self-pay | Admitting: Emergency Medicine

## 2020-11-04 ENCOUNTER — Emergency Department (INDEPENDENT_AMBULATORY_CARE_PROVIDER_SITE_OTHER)
Admission: EM | Admit: 2020-11-04 | Discharge: 2020-11-04 | Disposition: A | Discharge status: Home / Self Care | Payer: 59 | Source: Home / Self Care

## 2020-11-04 ENCOUNTER — Emergency Department (INDEPENDENT_AMBULATORY_CARE_PROVIDER_SITE_OTHER): Payer: 59

## 2020-11-04 DIAGNOSIS — M25562 Pain in left knee: Secondary | ICD-10-CM

## 2020-11-04 DIAGNOSIS — M25462 Effusion, left knee: Secondary | ICD-10-CM | POA: Diagnosis not present

## 2020-11-04 DIAGNOSIS — S8002XA Contusion of left knee, initial encounter: Secondary | ICD-10-CM

## 2020-11-04 MED ORDER — METHYLPREDNISOLONE 4 MG PO TBPK: ORAL_TABLET | ORAL | 0 refills | Status: DC

## 2020-11-04 MED ORDER — ACETAMINOPHEN 325 MG PO TABS
650.0000 mg | ORAL_TABLET | Freq: Once | ORAL | Status: AC
  Administered 2020-11-04: 650 mg via ORAL

## 2020-11-04 NOTE — Discharge Instructions (Signed)
Try to stay off your feet a bit more the next few days Take the Medrol Dosepak as directed.  Take all of day 1 today.  Take 3 pills now and 3 pills at bedtime, tomorrow follow the schedule Put ice on your knee for 20 minutes every 2-4 hours Wear brace for comfort Call if not improving by Monday for a sports medicine appointment.  If you have difficulty making this appointment let us know

## 2020-11-04 NOTE — ED Provider Notes (Signed)
[facility]    CSN: [id number] Arrival date & time: 11/04/20  1714      History   Chief Complaint Chief Complaint  Patient presents with   Knee Pain    left    HPI Daniel Riddle is a 45 y.o. male.   HPI  Patient accidentally hit the inside of his knee with a sledgehammer on Monday.  He states that his left knee is painful and swollen.  Hurts with weightbearing.  He states he is having difficulty with work and it feels like it is getting worse.  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  The pain is throbbing.  Its worse with standing.  He has not had any buckling or instability.  Past Medical History:  Diagnosis Date   Hypertension     Patient Active Problem List   Diagnosis Date Noted   Snoring 10/09/2020   Onychomycosis 10/31/2019   Erectile dysfunction 05/29/2017   HYPERTENSION, BENIGN 08/22/2008   CHEST PAIN, ATYPICAL 08/22/2008   GERD 03/20/2006   RHINITIS, ALLERGIC 10/25/2005    Past Surgical History:  Procedure Laterality Date   EYE SURGERY         Home Medications    Prior to Admission medications   Medication Sig Start Date End Date Taking? Authorizing Provider  methylPREDNISolone (MEDROL DOSEPAK) 4 MG TBPK tablet tad 11/04/20  Yes Eustace Moore, MD  amLODipine (NORVASC) 5 MG tablet TAKE 1 TABLET(5 MG) BY MOUTH DAILY 10/09/20   Agapito Games, MD  terbinafine (LAMISIL) 250 MG tablet Take 1 tablet (250 mg total) by mouth daily. Patient not taking: Reported on 11/04/2020 10/31/19   Everrett Coombe, DO    Family History Family History  Problem Relation Age of Onset   CAD Mother        31s   Heart failure Father    Hypertension Father    Cancer Father    CAD Paternal Uncle        9s    Social History Social History   Tobacco Use   Smoking status: Never   Smokeless tobacco: Never  Substance Use Topics   Alcohol use: No   Drug use: No      Allergies   Patient has no known allergies.   Review of Systems Review of Systems See HPI  Physical Exam Triage Vital Signs ED Triage Vitals  Enc Vitals Group     BP 11/04/20 1741 (!) 143/96     Pulse Rate 11/04/20 1741 91     Resp 11/04/20 1741 16     Temp 11/04/20 1741 99 F (37.2 C)     Temp Source 11/04/20 1741 Oral     SpO2 11/04/20 1741 98 %     Weight 11/04/20 1743 275 lb (124.7 kg)     Height 11/04/20 1743 6\' 3"  (1.905 m)     Head Circumference --      Peak Flow --      Pain Score 11/04/20 1742 5     Pain Loc --      Pain Edu? --      Excl. in GC? --    No data found.  Updated Vital Signs BP (!) 143/96 (BP Location: Left Arm)   Pulse 91   Temp 99 F (37.2 C) (Oral)   Resp 16   Ht 6\' 3"  (1.905 m)   Wt  124.7 kg   SpO2 98%   BMI 34.37 kg/m      Physical Exam Constitutional:      General: He is not in acute distress.    Appearance: He is well-developed.  HENT:     Head: Normocephalic and atraumatic.     Mouth/Throat:     Comments: Mask is in place Eyes:     Conjunctiva/sclera: Conjunctivae normal.     Pupils: Pupils are equal, round, and reactive to light.  Cardiovascular:     Rate and Rhythm: Normal rate.  Pulmonary:     Effort: Pulmonary effort is normal. No respiratory distress.  Abdominal:     General: There is no distension.     Palpations: Abdomen is soft.  Musculoskeletal:        General: Normal range of motion.     Cervical back: Normal range of motion.     Comments: Left knee has a trace effusion.  There is tenderness on the medial joint line.  There is a questionable palpable foreign body at the lateral joint line.  Range of motion is good.  Instability is not present  Skin:    General: Skin is warm and dry.  Neurological:     Mental Status: He is alert.     Gait: Gait abnormal.     UC Treatments / Results  Labs (all labs ordered are listed, but only abnormal results are displayed) Labs Reviewed - No data to  display  EKG   Radiology DG Knee Complete 4 Views Left  Result Date: 11/04/2020 CLINICAL DATA:  Left knee pain after injury. EXAM: LEFT KNEE - COMPLETE 4+ VIEW COMPARISON:  None. FINDINGS: Small joint effusion is present. There is no fracture or dislocation. Joint spaces are well maintained. No focal osseous lesion. IMPRESSION: 1. Small joint effusion. 2. No acute bony abnormality. Electronically Signed   By: Darliss Cheney M.D.   On: 11/04/2020 18:35    Procedures Procedures (including critical care time)  Medications Ordered in UC Medications  acetaminophen (TYLENOL) tablet 650 mg (650 mg Oral Given 11/04/20 1752)    Initial Impression / Assessment and Plan / UC Course  I have reviewed the triage vital signs and the nursing notes.  Pertinent labs & imaging results that were available during my care of the patient were reviewed by me and considered in my medical decision making (see chart for details).     X-rays look good.  I explained to the patient that if he has damaged the cartilage in his knee, he will need additional imaging like a CAT scan or MRI.  He needs to see the sports medicine doctor with his family doctor's office if he fails to improve with conservative treatment.  Okay to give him steroids, brace, and some rest. Final Clinical Impressions(s) / UC Diagnoses   Final diagnoses:  Contusion of left knee, initial encounter  Acute pain of left knee  Effusion of left knee     Discharge Instructions      Try to stay off your feet a bit more the next few days Take the Medrol Dosepak as directed.  Take all of day 1 today.  Take 3 pills now and 3 pills at bedtime, tomorrow follow the schedule Put ice on your knee for 20 minutes every 2-4 hours Wear brace for comfort Call if not improving by Monday for a sports medicine appointment.  If you have difficulty making this appointment let us know    ED Prescriptions  Medication Sig Dispense Auth. Provider    methylPREDNISolone (MEDROL DOSEPAK) 4 MG TBPK tablet tad 21 tablet Eustace Moore, MD      PDMP not reviewed this encounter.   Eustace Moore, MD 11/04/20 [phone]

## 2020-11-04 NOTE — ED Triage Notes (Signed)
Pt hit the inside of his left knee w/ a sledgehammer on Monday  No OTC pain meds  Unable to rest left leg - pt has been working since then  Describes pain as throbbing Pain is worse w/ standing

## 2020-11-23 ENCOUNTER — Encounter: Payer: 59 | Admitting: Family Medicine

## 2020-12-04 ENCOUNTER — Ambulatory Visit (HOSPITAL_BASED_OUTPATIENT_CLINIC_OR_DEPARTMENT_OTHER): Payer: 59 | Attending: Family Medicine | Admitting: Internal Medicine

## 2021-08-09 ENCOUNTER — Emergency Department: Admission: EM | Admit: 2021-08-09 | Discharge: 2021-08-09 | Discharge status: Against Medical Advice | Payer: 59

## 2021-08-10 ENCOUNTER — Ambulatory Visit: Payer: BC Managed Care – PPO | Admitting: Family Medicine

## 2021-08-10 ENCOUNTER — Encounter: Payer: Self-pay | Admitting: Family Medicine

## 2021-08-10 VITALS — BP 143/84 | HR 95 | Ht 75.0 in | Wt 270.0 lb

## 2021-08-10 DIAGNOSIS — I10 Essential (primary) hypertension: Secondary | ICD-10-CM | POA: Diagnosis not present

## 2021-08-10 DIAGNOSIS — R0683 Snoring: Secondary | ICD-10-CM | POA: Diagnosis not present

## 2021-08-10 MED ORDER — AMLODIPINE BESYLATE 5 MG PO TABS: ORAL_TABLET | ORAL | 0 refills | Status: AC

## 2021-08-10 NOTE — Assessment & Plan Note (Signed)
BP is better today in clinic however I think that he would benefit with adding amlodipine back on.  5mg  amlodipine sent in with plan to follow up in 2 weeks for nurse visit to check BP and 3 months with Dr. .

## 2021-08-10 NOTE — Patient Instructions (Addendum)
Restart amlodipine.  Return in 2 weeks for nurse visit to check BP.    Managing Your Hypertension Hypertension, also called high blood pressure, is when the force of the blood pressing against the walls of the arteries is too strong. Arteries are blood vessels that carry blood from your heart throughout your body. Hypertension forces the heart to work harder to pump blood and may cause the arteries to become narrow or stiff. Understanding blood pressure readings A blood pressure reading includes a higher number over a lower number: The first, or top, number is called the systolic pressure. It is a measure of the pressure in your arteries as your heart beats. The second, or bottom number, is called the diastolic pressure. It is a measure of the pressure in your arteries as the heart relaxes. For most people, a normal blood pressure is below 120/80. Your personal target blood pressure may vary depending on your medical conditions, your age, and other factors. Blood pressure is classified into four stages. Based on your blood pressure reading, your health care provider may use the following stages to determine what type of treatment you need, if any. Systolic pressure and diastolic pressure are measured in a unit called millimeters of mercury (mmHg). Normal Systolic pressure: below 120. Diastolic pressure: below 80. Elevated Systolic pressure: 120-129. Diastolic pressure: below 80. Hypertension stage 1 Systolic pressure: 130-139. Diastolic pressure: 80-89. Hypertension stage 2 Systolic pressure: 140 or above. Diastolic pressure: 90 or above. How can this condition affect me? Managing your hypertension is very important. Over time, hypertension can damage the arteries and decrease blood flow to parts of the body, including the brain, heart, and kidneys. Having untreated or uncontrolled hypertension can lead to: A heart attack. A stroke. A weakened blood vessel (aneurysm). Heart  failure. Kidney damage. Eye damage. Memory and concentration problems. Vascular dementia. What actions can I take to manage this condition? Hypertension can be managed by making lifestyle changes and possibly by taking medicines. Your health care provider will help you make a plan to bring your blood pressure within a normal range. You may be referred for counseling on a healthy diet and physical activity. Nutrition  Eat a diet that is high in fiber and potassium, and low in salt (sodium), added sugar, and fat. An example eating plan is called the DASH diet. DASH stands for Dietary Approaches to Stop Hypertension. To eat this way: Eat plenty of fresh fruits and vegetables. Try to fill one-half of your plate at each meal with fruits and vegetables. Eat whole grains, such as whole-wheat pasta, brown rice, or whole-grain bread. Fill about one-fourth of your plate with whole grains. Eat low-fat dairy products. Avoid fatty cuts of meat, processed or cured meats, and poultry with skin. Fill about one-fourth of your plate with lean proteins such as fish, chicken without skin, beans, eggs, and tofu. Avoid pre-made and processed foods. These tend to be higher in sodium, added sugar, and fat. Reduce your daily sodium intake. Many people with hypertension should eat less than 1,500 mg of sodium a day. Lifestyle  Work with your health care provider to maintain a healthy body weight or to lose weight. Ask what an ideal weight is for you. Get at least 30 minutes of exercise that causes your heart to beat faster (aerobic exercise) most days of the week. Activities may include walking, swimming, or biking. Include exercise to strengthen your muscles (resistance exercise), such as weight lifting, as part of your weekly exercise routine. Try  to do these types of exercises for 30 minutes at least 3 days a week. Do not use any products that contain nicotine or tobacco. These products include cigarettes, chewing  tobacco, and vaping devices, such as e-cigarettes. If you need help quitting, ask your health care provider. Control any long-term (chronic) conditions you have, such as high cholesterol or diabetes. Identify your sources of stress and find ways to manage stress. This may include meditation, deep breathing, or making time for fun activities. Alcohol use Do not drink alcohol if: Your health care provider tells you not to drink. You are pregnant, may be pregnant, or are planning to become pregnant. If you drink alcohol: Limit how much you have to: 0-1 drink a day for women. 0-2 drinks a day for men. Know how much alcohol is in your drink. In the [place] one drink equals one 12 oz bottle of beer (355 mL), one 5 oz glass of wine (148 mL), or one 1 oz glass of hard liquor (44 mL). Medicines Your health care provider may prescribe medicine if lifestyle changes are not enough to get your blood pressure under control and if: Your systolic blood pressure is 130 or higher. Your diastolic blood pressure is 80 or higher. Take medicines only as told by your health care provider. Follow the directions carefully. Blood pressure medicines must be taken as told by your health care provider. The medicine does not work as well when you skip doses. Skipping doses also puts you at risk for problems. Monitoring Before you monitor your blood pressure: Do not smoke, drink caffeinated beverages, or exercise within 30 minutes before taking a measurement. Use the bathroom and empty your bladder (urinate). Sit quietly for at least 5 minutes before taking measurements. Monitor your blood pressure at home as told by your health care provider. To do this: Sit with your back straight and supported. Place your feet flat on the floor. Do not cross your legs. Support your arm on a flat surface, such as a table. Make sure your upper arm is at heart level. Each time you measure, take two or three readings one minute apart and  record the results. You may also need to have your blood pressure checked regularly by your health care provider. General information Talk with your health care provider about your diet, exercise habits, and other lifestyle factors that may be contributing to hypertension. Review all the medicines you take with your health care provider because there may be side effects or interactions. Keep all follow-up visits. Your health care provider can help you create and adjust your plan for managing your high blood pressure. Where to find more information National Heart, Lung, and Blood Institute: https://wilson-eaton.com/ American Heart Association: www.heart.org Contact a health care provider if: You think you are having a reaction to medicines you have taken. You have repeated (recurrent) headaches. You feel dizzy. You have swelling in your ankles. You have trouble with your vision. Get help right away if: You develop a severe headache or confusion. You have unusual weakness or numbness, or you feel faint. You have severe pain in your chest or abdomen. You vomit repeatedly. You have trouble breathing. These symptoms may be an emergency. Get help right away. Call 911. Do not wait to see if the symptoms will go away. Do not drive yourself to the hospital. Summary Hypertension is when the force of blood pumping through your arteries is too strong. If this condition is not controlled, it may put you at  risk for serious complications. Your personal target blood pressure may vary depending on your medical conditions, your age, and other factors. For most people, a normal blood pressure is less than 120/80. Hypertension is managed by lifestyle changes, medicines, or both. Lifestyle changes to help manage hypertension include losing weight, eating a healthy, low-sodium diet, exercising more, stopping smoking, and limiting alcohol. This information is not intended to replace advice given to you by your health  care provider. Make sure you discuss any questions you have with your health care provider. Document Revised: 09/17/2020 Document Reviewed: 09/17/2020 Elsevier Patient Education  2023 [employer].

## 2021-08-10 NOTE — Assessment & Plan Note (Signed)
History of heavy snoring and STOP-BANG 7/8.  Referral placed for sleep study.

## 2021-08-10 NOTE — Progress Notes (Signed)
Daniel Riddle - 46 y.o. male MRN [id number]  Date of birth: [birth year]  Subjective Chief Complaint  Patient presents with   Hypertension    HPI Daniel Riddle is a 46 y.o. male here today for blood pressure check.    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  Previously treated with amlodipine but has not been taking for the past year.  Admits diet has not been good.  Fairly sedentary as well.  It appears that he was referred for a sleep study previously but didn't have this completed.  He denies symptoms related to HTN including chest pain, shortness of breath ,palpitations, headache or vision changes.    ROS:  A comprehensive ROS was completed and negative except as noted per HPI  No Known Allergies  Past Medical History:  Diagnosis Date   Hypertension     Past Surgical History:  Procedure Laterality Date   EYE SURGERY      Social History   Socioeconomic History   Marital status: Divorced    Spouse name: Not on file   Number of children: Not on file   Years of education: Not on file   Highest education level: Not on file  Occupational History   Not on file  Tobacco Use   Smoking status: Never   Smokeless tobacco: Never  Substance and Sexual Activity   Alcohol use: No   Drug use: No   Sexual activity: Not on file  Other Topics Concern   Not on file  Social History Narrative   Not on file   Social Determinants of Health   Financial Resource Strain: Not on file  Food Insecurity: Not on file  Transportation Needs: Not on file  Physical Activity: Not on file  Stress: Not on file  Social Connections: Not on file    Family History  Problem Relation Age of Onset   CAD Mother        57s   Heart failure Father    Hypertension Father    Cancer Father    CAD Paternal Uncle        3s    Health Maintenance  Topic Date Due   COVID-19 Vaccine (3 - Pfizer series) 10/25/2021 (Originally 12/03/2019)   COLONOSCOPY (Pts 45-63yrs Insurance coverage will  need to be confirmed)  08/11/2022 (Originally 02/13/2019)   Hepatitis C Screening  08/11/2022 (Originally 02/13/1992)   HIV Screening  08/11/2022 (Originally 02/12/1989)   INFLUENZA VACCINE  08/17/2021   TETANUS/TDAP  05/14/2031   Pneumococcal Vaccine 74-38 Years old  Aged Out   HPV VACCINES  Aged Out     ----------------------------------------------------------------------------------------------------------------------------------------------------------------------------------------------------------------- Physical Exam BP (!) 143/84 (BP Location: Left Arm, Patient Position: Sitting, Cuff Size: Large)   Pulse 95   Ht 6\' 3"  (1.905 m)   Wt 270 lb (122.5 kg)   SpO2 95%   BMI 33.75 kg/m   Physical Exam Constitutional:      Appearance: Normal appearance.  HENT:     Head: Normocephalic and atraumatic.  Eyes:     General: No scleral icterus. Cardiovascular:     Rate and Rhythm: Normal rate and regular rhythm.  Pulmonary:     Effort: Pulmonary effort is normal.     Breath sounds: Normal breath sounds.  Musculoskeletal:     Cervical back: Neck supple.  Neurological:     General: No focal deficit present.     Mental Status: He is alert.  Psychiatric:        Mood  and Affect: Mood normal.        Behavior: Behavior normal.     ------------------------------------------------------------------------------------------------------------------------------------------------------------------------------------------------------------------- Assessment and Plan  HYPERTENSION, BENIGN BP is better today in clinic however I think that he would benefit with adding amlodipine back on.  5mg  amlodipine sent in with plan to follow up in 2 weeks for nurse visit to check BP and 3 months with Dr. .   Snoring History of heavy snoring and STOP-BANG 7/8.  Referral placed for sleep study.     No orders of the defined types were placed in this encounter.   No follow-ups on  file.    This visit occurred during the SARS-CoV-2 public health emergency.  Safety protocols were in place, including screening questions prior to the visit, additional usage of staff PPE, and extensive cleaning of exam room while observing appropriate contact time as indicated for disinfecting solutions.

## 2021-08-24 ENCOUNTER — Ambulatory Visit: Payer: BC Managed Care – PPO

## 2021-10-20 ENCOUNTER — Ambulatory Visit
Admission: EM | Admit: 2021-10-20 | Discharge: 2021-10-20 | Disposition: A | Discharge status: Home / Self Care | Payer: BC Managed Care – PPO | Attending: Family Medicine | Admitting: Family Medicine

## 2021-10-20 ENCOUNTER — Encounter: Payer: Self-pay | Admitting: Emergency Medicine

## 2021-10-20 DIAGNOSIS — J069 Acute upper respiratory infection, unspecified: Secondary | ICD-10-CM

## 2021-10-20 MED ORDER — BENZONATATE 100 MG PO CAPS: 100.0000 mg | ORAL_CAPSULE | Freq: Three times a day (TID) | ORAL | 0 refills | Status: DC

## 2021-10-20 MED ORDER — PREDNISONE 20 MG PO TABS: 20.0000 mg | ORAL_TABLET | Freq: Two times a day (BID) | ORAL | 0 refills | Status: DC

## 2021-10-20 MED ORDER — AZITHROMYCIN 250 MG PO TABS: ORAL_TABLET | ORAL | 0 refills | Status: DC

## 2021-10-20 NOTE — ED Provider Notes (Signed)
[name]    CSN: [id number] Arrival date & time: 10/20/21  1500      History   Chief Complaint Chief Complaint  Patient presents with   Cough    HPI Daniel Riddle is a 46 y.o. male.   HPI  Patient is on his sixth/seventh day of illness.  He states he had a COVID test done on the first day he became sick and it was negative.  He has cough, cold, runny nose, fever, chills, body aches and headache that are worsening over the last week.  He has continued to try to work.  He was sent home from work today because of his cough and illness.  Does not have underlying lung disease such as asthma or emphysema.  Non-smoker.  No one else at home is sick.  Past Medical History:  Diagnosis Date   Hypertension     Patient Active Problem List   Diagnosis Date Noted   Snoring 10/09/2020   Onychomycosis 10/31/2019   Erectile dysfunction 05/29/2017   HYPERTENSION, BENIGN 08/22/2008   CHEST PAIN, ATYPICAL 08/22/2008   GERD 03/20/2006   RHINITIS, ALLERGIC 10/25/2005    Past Surgical History:  Procedure Laterality Date   EYE SURGERY         Home Medications    Prior to Admission medications   Medication Sig Start Date End Date Taking? Authorizing Provider  azithromycin (ZITHROMAX Z-PAK) 250 MG tablet Take 2 pills right away.  After this take 1 pill a day until gone 10/20/21  Yes Eustace Moore, MD  benzonatate (TESSALON) 100 MG capsule Take 1 capsule (100 mg total) by mouth every 8 (eight) hours. 10/20/21  Yes Eustace Moore, MD  predniSONE (DELTASONE) 20 MG tablet Take 1 tablet (20 mg total) by mouth 2 (two) times daily with a meal. 10/20/21  Yes Eustace Moore, MD  amLODipine (NORVASC) 5 MG tablet TAKE 1 TABLET(5 MG) BY MOUTH DAILY 08/10/21   Everrett Coombe, DO    Family History Family History  Problem Relation Age of Onset   CAD Mother        44s   Heart failure Father    Hypertension Father    Cancer Father    CAD Paternal Uncle        75s     Social History Social History   Tobacco Use   Smoking status: Never   Smokeless tobacco: Never  Substance Use Topics   Alcohol use: No   Drug use: No     Allergies   Patient has no known allergies.   Review of Systems Review of Systems See HPI  Physical Exam Triage Vital Signs ED Triage Vitals  Enc Vitals Group     BP 10/20/21 1518 131/88     Pulse Rate 10/20/21 1518 94     Resp 10/20/21 1518 18     Temp 10/20/21 1518 99 F (37.2 C)     Temp Source 10/20/21 1518 Oral     SpO2 10/20/21 1518 98 %     Weight --      Height --      Head Circumference --      Peak Flow --      Pain Score 10/20/21 1519 0     Pain Loc --      Pain Edu? --      Excl. in GC? --    No data found.  Updated Vital Signs BP 131/88 (BP Location: Right Arm)  Pulse 94   Temp 99 F (37.2 C) (Oral)   Resp 18   SpO2 98%      Physical Exam Constitutional:      General: He is not in acute distress.    Appearance: He is well-developed. He is ill-appearing.  HENT:     Head: Normocephalic and atraumatic.     Right Ear: Tympanic membrane and ear canal normal.     Left Ear: Tympanic membrane and ear canal normal.     Nose: Congestion and rhinorrhea present.     Mouth/Throat:     Mouth: Mucous membranes are moist.     Pharynx: Posterior oropharyngeal erythema present.  Eyes:     Conjunctiva/sclera: Conjunctivae normal.     Pupils: Pupils are equal, round, and reactive to light.     Comments: Mild conjunctival injection bilaterally  Cardiovascular:     Rate and Rhythm: Normal rate and regular rhythm.     Heart sounds: Normal heart sounds.  Pulmonary:     Effort: Pulmonary effort is normal. No respiratory distress.     Breath sounds: Normal breath sounds. No wheezing or rhonchi.  Abdominal:     General: There is no distension.     Palpations: Abdomen is soft.  Musculoskeletal:        General: Normal range of motion.     Cervical back: Normal range of motion.  Skin:    General:  Skin is warm and dry.  Neurological:     Mental Status: He is alert.  Psychiatric:        Mood and Affect: Mood normal.        Behavior: Behavior normal.      UC Treatments / Results  Labs (all labs ordered are listed, but only abnormal results are displayed) Labs Reviewed - No data to display  EKG   Radiology No results found.  Procedures Procedures (including critical care time)  Medications Ordered in UC Medications - No data to display  Initial Impression / Assessment and Plan / UC Course  I have reviewed the triage vital signs and the nursing notes.  Pertinent labs & imaging results that were available during my care of the patient were reviewed by me and considered in my medical decision making (see chart for details).     Final Clinical Impressions(s) / UC Diagnoses   Final diagnoses:  Acute upper respiratory infection     Discharge Instructions      Make sure you are drinking lots of fluids Take your antibiotic as directed.  2 pills today then once a day until gone Take prednisone daily for 5 days Take cough medicine as needed Call or return if not improving by the weekend   ED Prescriptions     Medication Sig Dispense Auth. Provider   azithromycin (ZITHROMAX Z-PAK) 250 MG tablet Take 2 pills right away.  After this take 1 pill a day until gone 6 tablet Raylene Everts, MD   predniSONE (DELTASONE) 20 MG tablet Take 1 tablet (20 mg total) by mouth 2 (two) times daily with a meal. 10 tablet Raylene Everts, MD   benzonatate (TESSALON) 100 MG capsule Take 1 capsule (100 mg total) by mouth every 8 (eight) hours. 21 capsule Raylene Everts, MD      PDMP not reviewed this encounter.   Raylene Everts, MD 10/20/21 [phone]

## 2021-10-20 NOTE — ED Triage Notes (Signed)
Pt states for the last week he has had cough, congestion and fatigue. States he is not improving and the cough feels worse, he had negative covid test a couple days ago at another UC.

## 2021-10-20 NOTE — Discharge Instructions (Signed)
Make sure you are drinking lots of fluids Take your antibiotic as directed.  2 pills today then once a day until gone Take prednisone daily for 5 days Take cough medicine as needed Call or return if not improving by the weekend

## 2021-11-11 ENCOUNTER — Ambulatory Visit: Payer: BC Managed Care – PPO | Admitting: Family Medicine

## 2021-11-11 DIAGNOSIS — R7301 Impaired fasting glucose: Secondary | ICD-10-CM | POA: Insufficient documentation

## 2021-11-11 NOTE — Progress Notes (Deleted)
   Established Patient Office Visit  Subjective   Patient ID: Daniel Riddle, male    DOB: May 11, 1975  Age: 46 y.o. MRN: [id number]  No chief complaint on file.   HPI   Hypertension- Pt denies chest pain, SOB, dizziness, or heart palpitations.  Taking meds as directed w/o problems.  Denies medication side effects.  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  He also scored a positive screen with his stop bang and was referred for a sleep study.  [facility] has actually reached out to him and left voicemails a couple of times try to get in touch with him to get him scheduled.  Impaired fasting glucose-last A1c about 6 months ago was 5.8.  {History (Optional):23778}  ROS    Objective:     There were no vitals taken for this visit. {Vitals History (Optional):23777}  Physical Exam Constitutional:      Appearance: He is well-developed.  HENT:     Head: Normocephalic and atraumatic.  Cardiovascular:     Rate and Rhythm: Normal rate and regular rhythm.     Heart sounds: Normal heart sounds.  Pulmonary:     Effort: Pulmonary effort is normal.     Breath sounds: Normal breath sounds.  Skin:    General: Skin is warm and dry.  Neurological:     Mental Status: He is alert and oriented to person, place, and time.  Psychiatric:        Behavior: Behavior normal.      No results found for any visits on 11/11/21.  {Labs (Optional):23779}  The 10-year ASCVD risk score (Arnett DK, et al., 2019) is: 7.9%    Assessment & Plan:   Problem List Items Addressed This Visit       Cardiovascular and Mediastinum   HYPERTENSION, BENIGN - Primary     Endocrine   IFG (impaired fasting glucose)     Other   Snoring    No follow-ups on file.    Beatrice Lecher, MD

## 2022-02-22 ENCOUNTER — Ambulatory Visit
Admission: EM | Admit: 2022-02-22 | Discharge: 2022-02-22 | Disposition: A | Discharge status: Home / Self Care | Payer: BC Managed Care – PPO

## 2022-02-22 DIAGNOSIS — A084 Viral intestinal infection, unspecified: Secondary | ICD-10-CM | POA: Diagnosis not present

## 2022-02-22 DIAGNOSIS — I10 Essential (primary) hypertension: Secondary | ICD-10-CM | POA: Diagnosis not present

## 2022-02-22 MED ORDER — ONDANSETRON 4 MG PO TBDP: 4.0000 mg | ORAL_TABLET | Freq: Three times a day (TID) | ORAL | 0 refills | Status: DC | PRN

## 2022-02-22 NOTE — ED Provider Notes (Signed)
 [facility]    CSN: [id number] Arrival date & time: 02/22/22  1235      History   Chief Complaint Chief Complaint  Patient presents with   Abdominal Pain   Nausea   Vomiting    HPI Daniel Riddle is a 47 y.o. male.   47 year old male with a past medical history of hypertension presents today secondary to abdominal cramping, nausea, and vomiting.  Patient states symptoms started abruptly upon awakening Sunday morning.  He had 3 episodes of vomiting on Sunday, none on Monday or today.  Patient also reported diarrhea more severe on Sunday, improved yesterday, and resolved today.  Patient states that his stomach feels queasy, and he has not eaten anything today.  He denies a fever.  He works as a mechanic, and states he did not wash his hands prior to eating lunch and feels that he might have consumed some engine grease causing his symptoms.  Additionally, patient made red snapper on Saturday and did not check the temperature prior to consuming.  Patient denies any blood in his stool.  Feels that symptoms are improving.  He did not take his blood pressure medication today.    Abdominal Pain   Past Medical History:  Diagnosis Date   Hypertension     Patient Active Problem List   Diagnosis Date Noted   IFG (impaired fasting glucose) 11/11/2021   Snoring 10/09/2020   Onychomycosis 10/31/2019   Erectile dysfunction 05/29/2017   HYPERTENSION, BENIGN 08/22/2008   CHEST PAIN, ATYPICAL 08/22/2008   GERD 03/20/2006   RHINITIS, ALLERGIC 10/25/2005    Past Surgical History:  Procedure Laterality Date   EYE SURGERY         Home Medications    Prior to Admission medications   Medication Sig Start Date End Date Taking? Authorizing Provider  ondansetron (ZOFRAN-ODT) 4 MG disintegrating tablet Take 1 tablet (4 mg total) by mouth every 8 (eight) hours as needed for nausea or vomiting. 02/22/22  Yes Baron Parmelee,  L, PA  amLODipine (NORVASC) 5 MG tablet TAKE 1 TABLET(5  MG) BY MOUTH DAILY 08/10/21   Matthews, Cody, DO  terbinafine (LAMISIL) 250 MG tablet Take 250 mg by mouth daily.    [provider]    Family History Family History  Problem Relation Age of Onset   CAD Mother        60s   Heart failure Father    Hypertension Father    Cancer Father    CAD Paternal Uncle        50 s    Social History Social History   Tobacco Use   Smoking status: Never   Smokeless tobacco: Never  Substance Use Topics   Alcohol use: No   Drug use: No     Allergies   Patient has no known allergies.   Review of Systems Review of Systems  Gastrointestinal:  Positive for abdominal pain.  As per HPI   Physical Exam Triage Vital Signs ED Triage Vitals  Enc Vitals Group     BP 02/22/22 1248 (!) 160/100     Pulse Rate 02/22/22 1248 90     Resp 02/22/22 1248 17     Temp 02/22/22 1248 98.4 F (36.9 C)     Temp Source 02/22/22 1248 Oral     SpO2 02/22/22 1248 97 %     Weight --      Height --      Head Circumference --      Peak  Flow --      Pain Score 02/22/22 1249 4     Pain Loc --      Pain Edu? --      Excl. in [place]? --    No data found.  Updated Vital Signs BP (!) 160/100 (BP Location: Right Arm)   Pulse 90   Temp 98.4 F (36.9 C) (Oral)   Resp 17   SpO2 97%   Visual Acuity Right Eye Distance:   Left Eye Distance:   Bilateral Distance:    Right Eye Near:   Left Eye Near:    Bilateral Near:     Physical Exam Vitals and nursing note reviewed.  Constitutional:      General: He is not in acute distress.    Appearance: He is well-developed. He is obese. He is not ill-appearing, toxic-appearing or diaphoretic.  HENT:     Head: Normocephalic and atraumatic.     Mouth/Throat:     Mouth: Mucous membranes are moist.     Pharynx: Oropharynx is clear. No pharyngeal swelling or oropharyngeal exudate.  Eyes:     Extraocular Movements: Extraocular movements intact.     Pupils: Pupils are equal, round, and reactive to light.   Cardiovascular:     Rate and Rhythm: Normal rate and regular rhythm.     Heart sounds: Normal heart sounds. No murmur heard.    No friction rub. No gallop.  Pulmonary:     Effort: Pulmonary effort is normal. No respiratory distress.     Breath sounds: Normal breath sounds. No stridor. No wheezing or rhonchi.  Abdominal:     General: Abdomen is flat. Bowel sounds are normal. There is no distension.     Palpations: Abdomen is soft. There is no hepatomegaly, splenomegaly or mass.     Tenderness: There is no abdominal tenderness. There is no right CVA tenderness, left CVA tenderness, guarding or rebound. Negative signs include Murphy's sign.     Hernia: No hernia is present.  Skin:    General: Skin is warm and dry.     Capillary Refill: Capillary refill takes less than 2 seconds.     Coloration: Skin is not pale.     Findings: No erythema or rash.  Neurological:     General: No focal deficit present.     Mental Status: He is alert.      UC Treatments / Results  Labs (all labs ordered are listed, but only abnormal results are displayed) Labs Reviewed - No data to display  EKG   Radiology No results found.  Procedures Procedures (including critical care time)  Medications Ordered in UC Medications - No data to display  Initial Impression / Assessment and Plan / UC Course  I have reviewed the triage vital signs and the nursing notes.  Pertinent labs & imaging results that were available during my care of the patient were reviewed by me and considered in my medical decision making (see chart for details).     Viral gastroenteritis - pts sx are improving. Pt appears well, VSS apart from BP. Will give PRN zofran to help with nausea, will avoid anti-diarrheals. No red flag s/sx HTN - pt did not take medications today. Encouraged pt to take zofran upon return home, followed by his norvasc. Monitor BP, goal 120/80  Final Clinical Impressions(s) / UC Diagnoses   Final  diagnoses:  Viral gastroenteritis  Essential hypertension     Discharge Instructions      Your symptoms sound  like viral gastroenteritis, which appears to be improving. Take zofran as needed for nausea or vomiting, you place this medication under the tongue every 8-12 hours. Take small sips of water, pedialyte, gatorade until tolerating. Eat BLAND foods - bananas, rice, apples, toast, boiled chicken, saltine crackers. Avoid spicy or greasy foods. Symptoms typically resolve in 3-5 days, but sometimes can take up to 7. Return for recheck if you develop new or worsening abdominal pain, fever, or any new concerns   Please take your daily blood pressure medications after your first dose of zofran to ensure your blood pressure comes down to a safer level.    ED Prescriptions     Medication Sig Dispense Auth. Provider   ondansetron (ZOFRAN-ODT) 4 MG disintegrating tablet Take 1 tablet (4 mg total) by mouth every 8 (eight) hours as needed for nausea or vomiting. 20 tablet Marcena Dias L, [place]      PDMP not reviewed this encounter.   Chaney Malling, [place] 02/22/22 1438

## 2022-02-22 NOTE — Discharge Instructions (Addendum)
Your symptoms sound like viral gastroenteritis, which appears to be improving. Take zofran as needed for nausea or vomiting, you place this medication under the tongue every 8-12 hours. Take small sips of water, pedialyte, gatorade until tolerating. Eat BLAND foods - bananas, rice, apples, toast, boiled chicken, saltine crackers. Avoid spicy or greasy foods. Symptoms typically resolve in 3-5 days, but sometimes can take up to 7. Return for recheck if you develop new or worsening abdominal pain, fever, or any new concerns   Please take your daily blood pressure medications after your first dose of zofran to ensure your blood pressure comes down to a safer level.

## 2022-02-22 NOTE — ED Triage Notes (Signed)
Pt c/o abd pain, nausea and vomiting x 3 days. Some diarrhea on Sunday and Monday. Abd pain is constant. Says it feels the same as when he had food poisoning in the past.    Pt is hypertensive today, says he did not take his BP meds this am d/t feeling unwell.

## 2022-03-28 ENCOUNTER — Other Ambulatory Visit: Payer: Self-pay | Admitting: Pharmacist

## 2022-03-28 NOTE — Patient Instructions (Signed)
Daniel Riddle,  Thank you for speaking with me today! As discussed, please look for the "omron" brand of upper arm blood pressure cuff- it is available on shelves at most pharmacies for ~$30-35. Keep taking your blood pressure medicine, and spot check some readings at home so we can be sure your blood pressure is well controlled, which will help reduce the risks of heart attack and stroke.  Below are some helpful tips about checking blood pressure at home:  Check your blood pressure twice weekly, and any time you have concerning symptoms like headache, chest pain, dizziness, shortness of breath, or vision changes.   Our goal is less than 140/90.  To appropriately check your blood pressure, make sure you do the following:  1) Avoid caffeine, exercise, or tobacco products for 30 minutes before checking. Empty your bladder. 2) Sit with your back supported in a flat-backed chair. Rest your arm on something flat (arm of the chair, table, etc). 3) Sit still with your feet flat on the floor, resting, for at least 5 minutes.  4) Check your blood pressure. Take 1-2 readings.  5) Write down these readings and bring with you to any provider appointments.  Bring your home blood pressure machine with you to a provider's office for accuracy comparison at least once a year.   Make sure you take your blood pressure medications before you come to any office visit, even if you were asked to fast for labs.  Take care, Luana Shu, PharmD Clinical Pharmacist [facility] [phone]

## 2022-03-28 NOTE — Progress Notes (Signed)
Patient appearing on report for [employer] - Hypertension Control report due to last documented ambulatory blood pressure of 160/100 on 02/22/22. Next appointment with PCP is not scheduled.   Outreached patient to discuss hypertension control and medication management.   Current antihypertensives: amlodipine '5mg'$  daily  Patient does not have an automated upper arm home BP machine.  Current blood pressure readings: not currently checking  Patient denies hypotensive signs and symptoms including dizziness, lightheadedness.  Patient denies hypertensive symptoms including headache, chest pain, shortness of breath.    Assessment/Plan: - Currently unknown control - - Reviewed appropriate administration of medication regimen - Discussed dietary modifications, such as reduced salt intake, focus on whole grains, vegetables, lean proteins - Discussed goal of 150 minutes of moderate intensity physical activity weekly - Recommend continue current regimen, purchase upper arm BP cuff, "omron" brand a good option for validated accuracy. Recommend checking home blood pressure numbers and calling office to get scheduled for routine PCP follow up. Pt verbalized understanding  Larinda Buttery, PharmD Clinical Pharmacist [facility] [phone]

## 2022-06-08 ENCOUNTER — Ambulatory Visit
Admission: EM | Admit: 2022-06-08 | Discharge: 2022-06-08 | Disposition: A | Discharge status: Home / Self Care | Payer: BC Managed Care – PPO | Attending: Family Medicine | Admitting: Family Medicine

## 2022-06-08 ENCOUNTER — Other Ambulatory Visit: Payer: Self-pay

## 2022-06-08 DIAGNOSIS — R059 Cough, unspecified: Secondary | ICD-10-CM | POA: Diagnosis not present

## 2022-06-08 DIAGNOSIS — J309 Allergic rhinitis, unspecified: Secondary | ICD-10-CM | POA: Diagnosis not present

## 2022-06-08 DIAGNOSIS — R509 Fever, unspecified: Secondary | ICD-10-CM | POA: Diagnosis not present

## 2022-06-08 DIAGNOSIS — J069 Acute upper respiratory infection, unspecified: Secondary | ICD-10-CM | POA: Diagnosis not present

## 2022-06-08 LAB — POCT INFLUENZA A/B
Influenza A, POC: NEGATIVE
Influenza B, POC: NEGATIVE

## 2022-06-08 LAB — POC SARS CORONAVIRUS 2 AG -  ED: SARS Coronavirus 2 Ag: NEGATIVE

## 2022-06-08 MED ORDER — PREDNISONE 20 MG PO TABS: ORAL_TABLET | ORAL | 0 refills | Status: DC

## 2022-06-08 MED ORDER — BENZONATATE 200 MG PO CAPS: 200.0000 mg | ORAL_CAPSULE | Freq: Three times a day (TID) | ORAL | 0 refills | Status: AC | PRN

## 2022-06-08 MED ORDER — PROMETHAZINE-DM 6.25-15 MG/5ML PO SYRP: 5.0000 mL | ORAL_SOLUTION | Freq: Two times a day (BID) | ORAL | 0 refills | Status: DC | PRN

## 2022-06-08 MED ORDER — FEXOFENADINE HCL 180 MG PO TABS: 180.0000 mg | ORAL_TABLET | Freq: Every day | ORAL | 0 refills | Status: AC

## 2022-06-08 MED ORDER — AZITHROMYCIN 250 MG PO TABS: 250.0000 mg | ORAL_TABLET | Freq: Every day | ORAL | 0 refills | Status: DC

## 2022-06-08 NOTE — Discharge Instructions (Addendum)
Advised patient to take medications as directed with food to completion.  Advised patient to take prednisone and Allegra with Zithromax daily for the next 5 days.  Advised may use Allegra as needed afterwards for concurrent postnasal drainage/drip.  Advised may use Tessalon Perles daily or as needed for cough.  Advised may use Promethazine DM at night prior to sleep for cough due to sedative effects.  Advised patient not to use cough medications together.  Advised patient may take OTC Tylenol 1 g every 6 hours for fever.  Encouraged increase daily water intake to 64 ounces per day while taking these medications.  Advised if symptoms worsen and/or unresolved please follow-up with PCP or here for further evaluation.

## 2022-06-08 NOTE — ED Provider Notes (Signed)
[name]    CSN: [id number] Arrival date & time: 06/08/22  1705      History   Chief Complaint Chief Complaint  Patient presents with   Cough    HPI Daniel Riddle is a 47 y.o. male.   HPI Very pleasant 47 year old male presents with fever and cough for 4-5 days.  PMH significant for obesity, HTN, and GERD.  Past Medical History:  Diagnosis Date   Hypertension     Patient Active Problem List   Diagnosis Date Noted   IFG (impaired fasting glucose) 11/11/2021   Snoring 10/09/2020   Onychomycosis 10/31/2019   Erectile dysfunction 05/29/2017   HYPERTENSION, BENIGN 08/22/2008   CHEST PAIN, ATYPICAL 08/22/2008   GERD 03/20/2006   RHINITIS, ALLERGIC 10/25/2005    Past Surgical History:  Procedure Laterality Date   EYE SURGERY         Home Medications    Prior to Admission medications   Medication Sig Start Date End Date Taking? Authorizing Provider  azithromycin (ZITHROMAX) 250 MG tablet Take 1 tablet (250 mg total) by mouth daily. Take first 2 tablets together, then 1 every day until finished. 06/08/22  Yes Trevor Iha, FNP  benzonatate (TESSALON) 200 MG capsule Take 1 capsule (200 mg total) by mouth 3 (three) times daily as needed for up to 7 days. 06/08/22 06/15/22 Yes Trevor Iha, FNP  fexofenadine [facility] ALLERGY) 180 MG tablet Take 1 tablet (180 mg total) by mouth daily for 15 days. 06/08/22 06/23/22 Yes Trevor Iha, FNP  predniSONE (DELTASONE) 20 MG tablet Take 3 tabs PO daily x 5 days. 06/08/22  Yes Trevor Iha, FNP  promethazine-dextromethorphan (PROMETHAZINE-DM) 6.25-15 MG/5ML syrup Take 5 mLs by mouth 2 (two) times daily as needed for cough. 06/08/22  Yes Trevor Iha, FNP  amLODipine (NORVASC) 5 MG tablet TAKE 1 TABLET(5 MG) BY MOUTH DAILY 08/10/21   Everrett Coombe, DO  ondansetron (ZOFRAN-ODT) 4 MG disintegrating tablet Take 1 tablet (4 mg total) by mouth every 8 (eight) hours as needed for nausea or vomiting. 02/22/22   Crain, Whitney  L, PA  terbinafine (LAMISIL) 250 MG tablet Take 250 mg by mouth daily.    [provider]    Family History Family History  Problem Relation Age of Onset   CAD Mother        4s   Heart failure Father    Hypertension Father    Cancer Father    CAD Paternal Uncle        61s    Social History Social History   Tobacco Use   Smoking status: Never   Smokeless tobacco: Never  Substance Use Topics   Alcohol use: No   Drug use: No     Allergies   Patient has no known allergies.   Review of Systems Review of Systems  Constitutional:  Positive for fever.  HENT:  Positive for congestion.   Respiratory:  Positive for cough.   All other systems reviewed and are negative.    Physical Exam Triage Vital Signs ED Triage Vitals  Enc Vitals Group     BP      Pulse      Resp      Temp      Temp src      SpO2      Weight      Height      Head Circumference      Peak Flow      Pain Score  Pain Loc      Pain Edu?      Excl. in [place]?    No data found.  Updated Vital Signs BP (!) 146/89 (BP Location: Right Arm)   Pulse (!) 101   Temp 99.3 F (37.4 C) (Oral)   Resp 17   SpO2 95%   Physical Exam Vitals and nursing note reviewed.  Constitutional:      Appearance: Normal appearance. He is obese. He is ill-appearing.  HENT:     Head: Normocephalic and atraumatic.     Right Ear: Tympanic membrane, ear canal and external ear normal.     Left Ear: Tympanic membrane, ear canal and external ear normal.     Nose:     Comments: Turbinates are erythematous/edematous    Mouth/Throat:     Mouth: Mucous membranes are moist.     Pharynx: Oropharynx is clear.     Comments: Significant amount of clear drainage of posterior oropharynx noted Eyes:     Extraocular Movements: Extraocular movements intact.     Conjunctiva/sclera: Conjunctivae normal.     Pupils: Pupils are equal, round, and reactive to light.  Cardiovascular:     Rate and Rhythm: Normal rate and  regular rhythm.     Pulses: Normal pulses.     Heart sounds: Normal heart sounds.  Pulmonary:     Effort: Pulmonary effort is normal.     Breath sounds: Normal breath sounds. No wheezing, rhonchi or rales.     Comments: Frequent nonproductive cough noted on exam Musculoskeletal:        General: Normal range of motion.     Cervical back: Normal range of motion and neck supple.  Skin:    General: Skin is warm and dry.  Neurological:     General: No focal deficit present.     Mental Status: He is alert and oriented to person, place, and time. Mental status is at baseline.      UC Treatments / Results  Labs (all labs ordered are listed, but only abnormal results are displayed) Labs Reviewed  POC SARS CORONAVIRUS 2 AG -  ED  POCT INFLUENZA A/B    EKG   Radiology No results found.  Procedures Procedures (including critical care time)  Medications Ordered in UC Medications - No data to display  Initial Impression / Assessment and Plan / UC Course  I have reviewed the triage vital signs and the nursing notes.  Pertinent labs & imaging results that were available during my care of the patient were reviewed by me and considered in my medical decision making (see chart for details).     MDM: 1.  Acute URI-Rx'd Zithromax 500 mg day 1, then 250 mg daily x 4 days; 2.  Cough, unspecified type-Rx'd Tessalon Perles 200 mg 3 times daily, as needed, promethazine DM 6.25-15 mg / 5 mL: Take 5 mL twice daily for cough, as needed; 3.  Allergic rhinitis, unspecified seasonality, unspecified trigger-Rx'd Allegra 180 mg daily x 5 days, then as needed; 4.  Fever-POCT Influenza and COVID-19 negative. Advised patient may take OTC Tylenol 1 g every 6 hours for fever.  Encouraged increase daily water intake to 64 ounces per day while taking these medications.  Advised if symptoms worsen and/or unresolved please follow-up with PCP or here for further evaluation.  2-day work excuse note provided to  patient per request prior to discharge.  Patient discharged home, hemodynamically stable. Final Clinical Impressions(s) / UC Diagnoses   Final diagnoses:  Fever, unspecified  Cough, unspecified type  Acute URI  Allergic rhinitis, unspecified seasonality, unspecified trigger     Discharge Instructions      Advised patient to take medications as directed with food to completion.  Advised patient to take prednisone and Allegra with Zithromax daily for the next 5 days.  Advised may use Allegra as needed afterwards for concurrent postnasal drainage/drip.  Advised may use Tessalon Perles daily or as needed for cough.  Advised may use Promethazine DM at night prior to sleep for cough due to sedative effects.  Advised patient not to use cough medications together.  Advised patient may take OTC Tylenol 1 g every 6 hours for fever.  Encouraged increase daily water intake to 64 ounces per day while taking these medications.  Advised if symptoms worsen and/or unresolved please follow-up with PCP or here for further evaluation.     ED Prescriptions     Medication Sig Dispense Auth. Provider   azithromycin (ZITHROMAX) 250 MG tablet Take 1 tablet (250 mg total) by mouth daily. Take first 2 tablets together, then 1 every day until finished. 6 tablet Trevor Iha, FNP   predniSONE (DELTASONE) 20 MG tablet Take 3 tabs PO daily x 5 days. 15 tablet Trevor Iha, FNP   fexofenadine [facility] ALLERGY) 180 MG tablet Take 1 tablet (180 mg total) by mouth daily for 15 days. 15 tablet Trevor Iha, FNP   benzonatate (TESSALON) 200 MG capsule Take 1 capsule (200 mg total) by mouth 3 (three) times daily as needed for up to 7 days. 40 capsule Trevor Iha, FNP   promethazine-dextromethorphan (PROMETHAZINE-DM) 6.25-15 MG/5ML syrup Take 5 mLs by mouth 2 (two) times daily as needed for cough. 118 mL Trevor Iha, FNP      PDMP not reviewed this encounter.   Trevor Iha, FNP 06/08/22 1758

## 2022-06-08 NOTE — ED Triage Notes (Signed)
Pt c/o cough x 2 days. Also some nasal congestion and chills. Tylenol prn.

## 2022-06-20 ENCOUNTER — Telehealth: Payer: Self-pay | Admitting: Family Medicine

## 2022-06-20 NOTE — Telephone Encounter (Signed)
Patient called requesting to have blood work done. Please order.

## 2022-06-21 NOTE — Telephone Encounter (Signed)
Pt doesn't have an upcoming appointment scheduled with Dr. Linford Arnold. He will need to schedule something and keep the appointment also.

## 2022-08-09 ENCOUNTER — Ambulatory Visit: Payer: BC Managed Care – PPO | Admitting: Family Medicine

## 2022-08-09 NOTE — Progress Notes (Deleted)
   Established Patient Office Visit  Subjective   Patient ID: Daniel Riddle, male    DOB: 08-30-1975  Age: 47 y.o. MRN: [id number]  No chief complaint on file.   HPI\ Hypertension- Pt denies chest pain, SOB, dizziness, or heart palpitations.  Taking meds as directed w/o problems.  Denies medication side effects.    Impaired fasting glucose-no increased thirst or urination. No symptoms consistent with hypoglycemia.   {History (Optional):23778}  ROS    Objective:     There were no vitals taken for this visit. {Vitals History (Optional):23777}  Physical Exam Constitutional:      Appearance: He is well-developed.  HENT:     Head: Normocephalic and atraumatic.  Cardiovascular:     Rate and Rhythm: Normal rate and regular rhythm.     Heart sounds: Normal heart sounds.  Pulmonary:     Effort: Pulmonary effort is normal.     Breath sounds: Normal breath sounds.  Skin:    General: Skin is warm and dry.  Neurological:     Mental Status: He is alert and oriented to person, place, and time.  Psychiatric:        Behavior: Behavior normal.    No results found for any visits on 08/09/22.  {Labs (Optional):23779}  The 10-year ASCVD risk score (Arnett DK, et al., 2019) is: 10%    Assessment & Plan:   Problem List Items Addressed This Visit       Cardiovascular and Mediastinum   HYPERTENSION, BENIGN - Primary     Endocrine   IFG (impaired fasting glucose)    No follow-ups on file.    Nani Gasser, MD

## 2022-12-21 ENCOUNTER — Telehealth: Payer: Self-pay

## 2022-12-21 NOTE — Telephone Encounter (Signed)
Copied from CRM [id number]. Topic: Referral - Request for Referral >> Dec 20, 2022 11:45 AM Cassiday T wrote: Did the patient discuss referral with their provider in the last year? Yes (If No - schedule appointment) (If Yes - send message)  Appointment offered? No  Type of order/referral and detailed reason for visit: SLEEP STUDY  FOR SLEEP APNEA   Preference of office, provider, location     If referral order, have you been seen by this specialty before? No (If Yes, this issue or another issue? When? Where?  Can we respond through MyChart? No

## 2022-12-21 NOTE — Telephone Encounter (Signed)
Patient is snoring and waking up trying to catch his breath. Patient schedule for office visit to evaluate the need for a sleep study.

## 2022-12-27 ENCOUNTER — Ambulatory Visit: Payer: BC Managed Care – PPO | Admitting: Family Medicine

## 2023-09-10 IMAGING — DX DG KNEE COMPLETE 4+V*L*
4 series · 4 of 4 positions shown · non-contrast
Comparison: None.

CLINICAL DATA: Left knee pain after injury.

EXAM:
LEFT KNEE - COMPLETE 4+ VIEW

[knee ap]
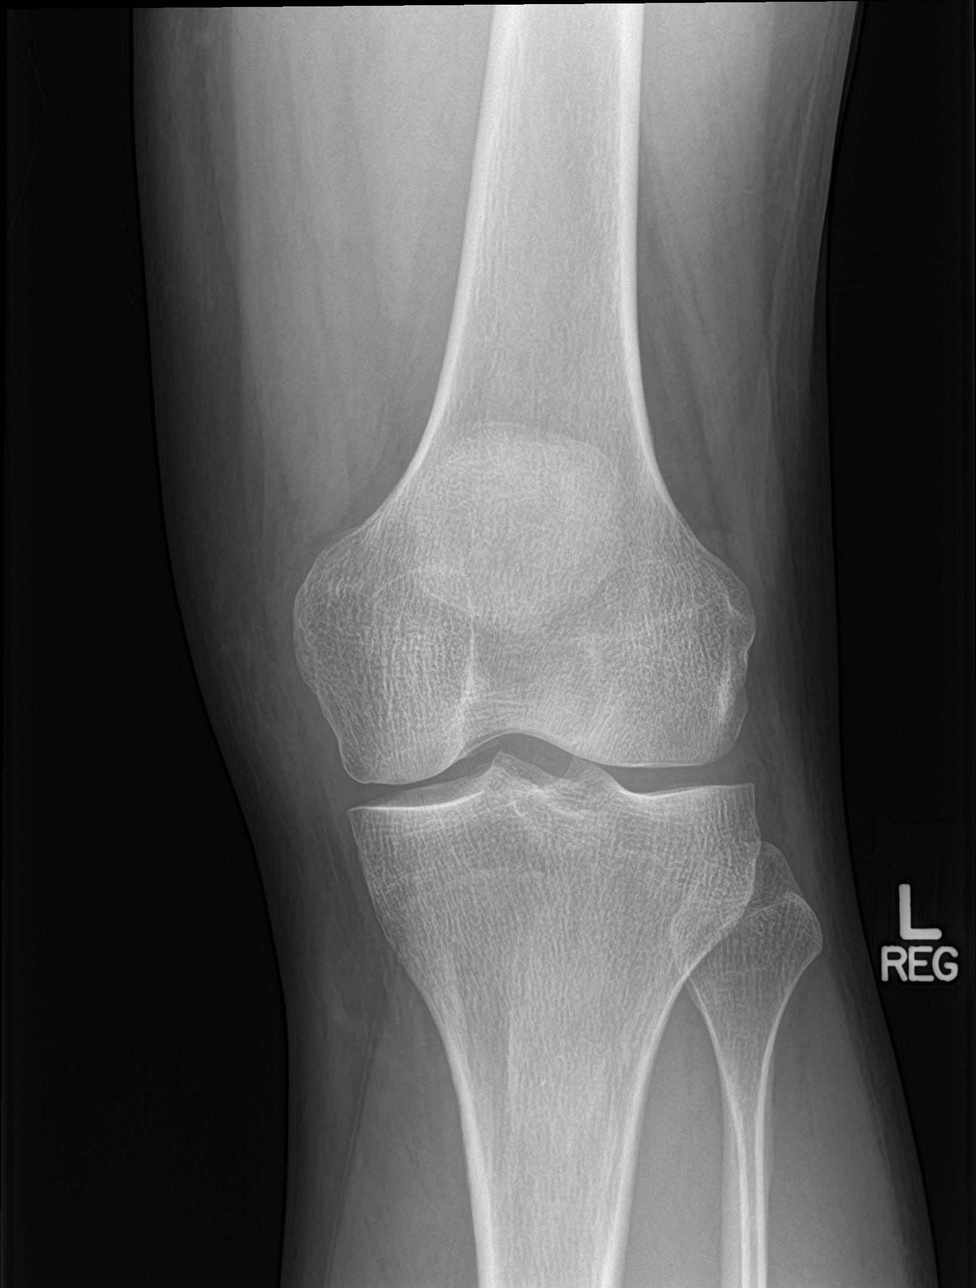

[knee lat]
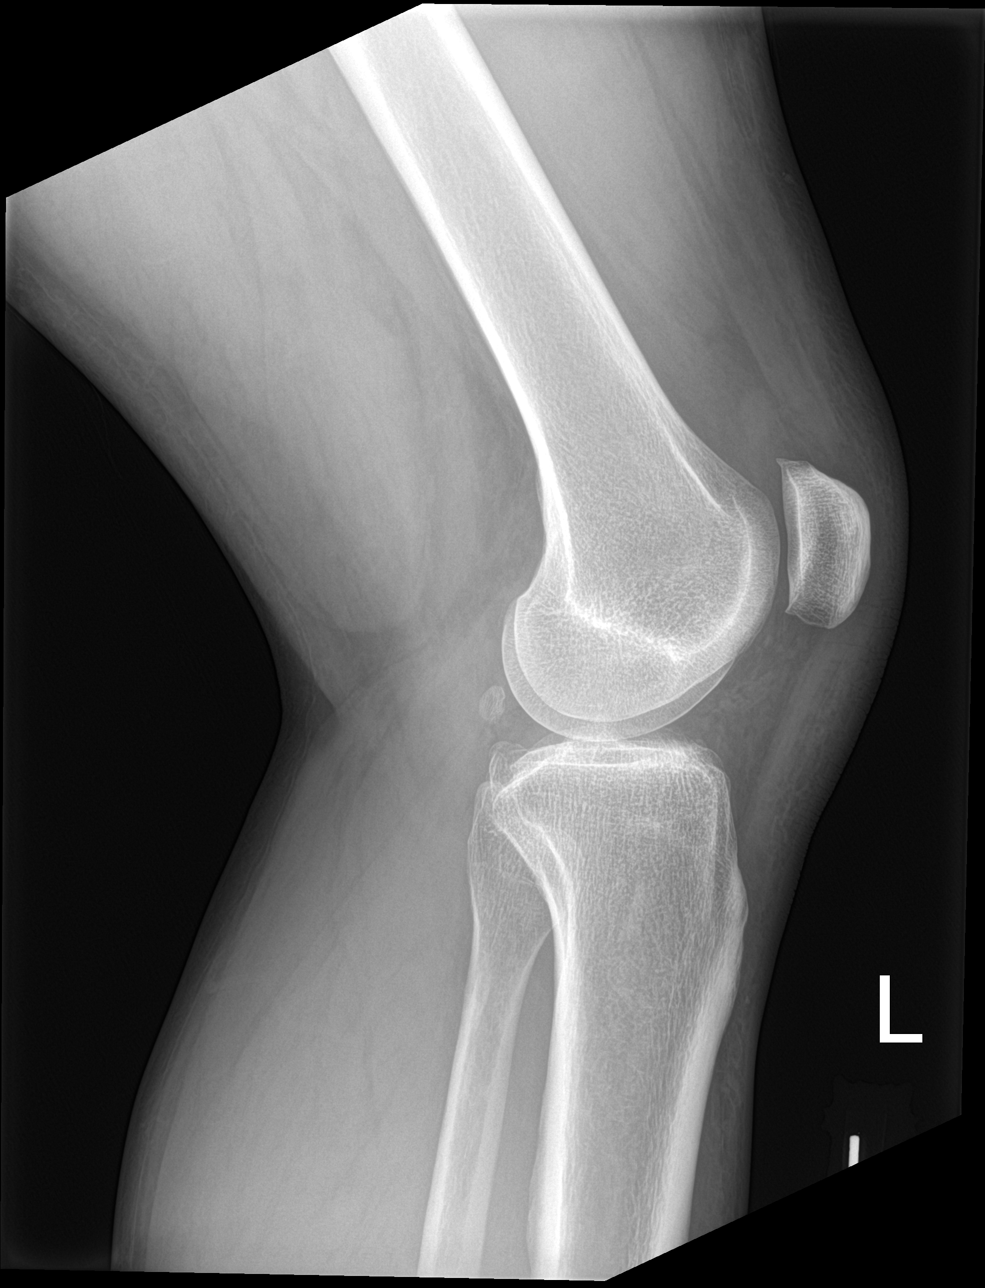

[knee obl (1 of 2)]
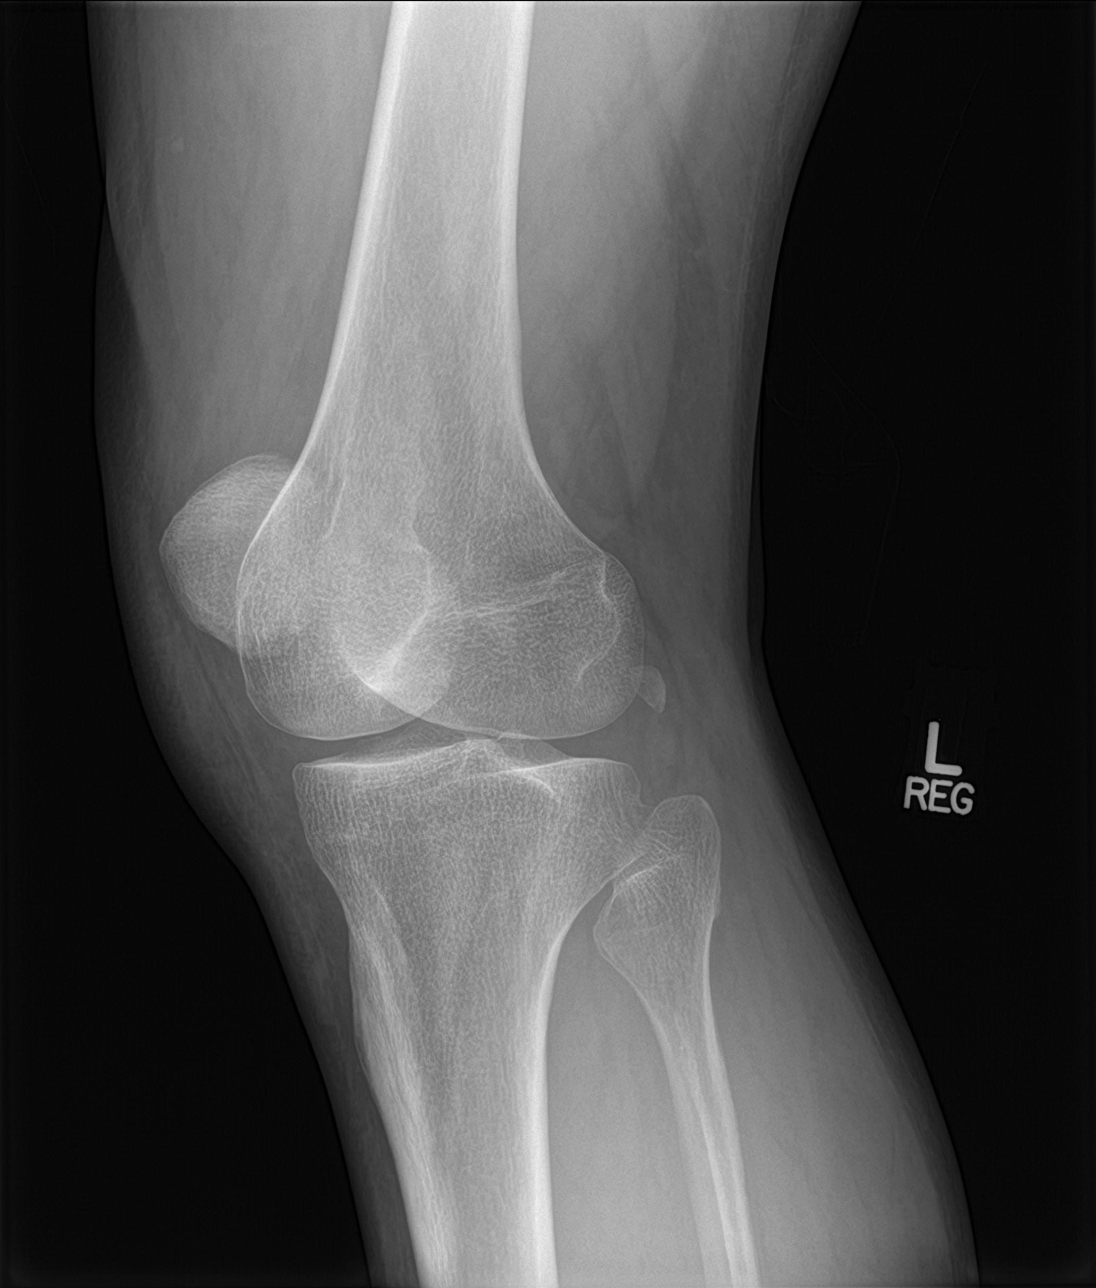

[knee obl (2 of 2)]
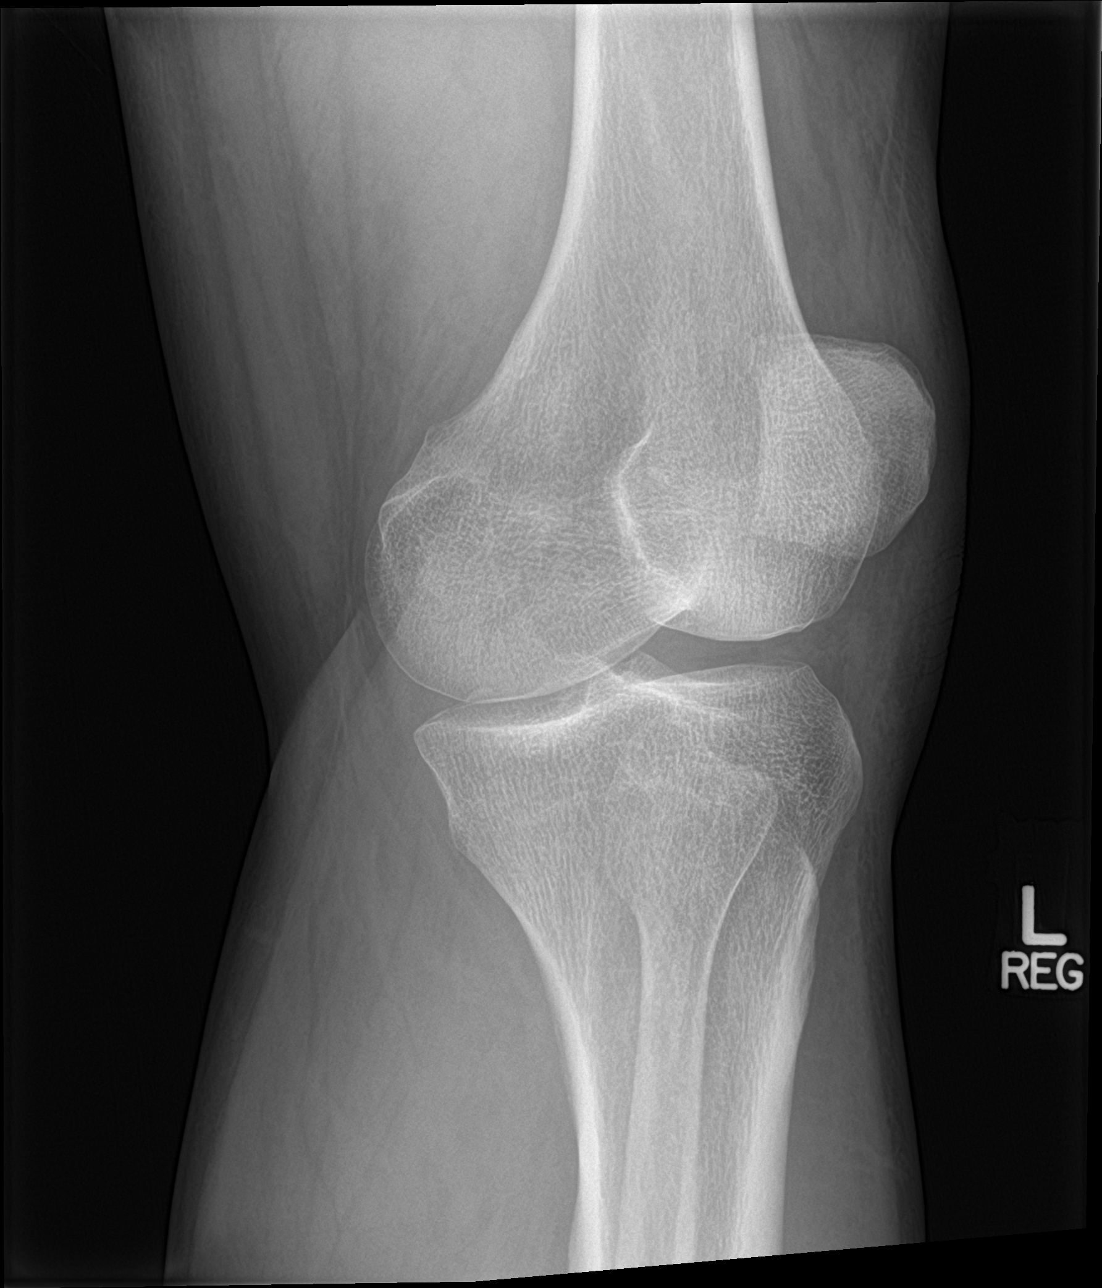

[4 of 4 positions shown; findings below may reference images not displayed]

FINDINGS: Small joint effusion is present. There is no fracture or
dislocation. Joint spaces are well maintained. No focal osseous
lesion.
IMPRESSION: 1. Small joint effusion.
2. No acute bony abnormality.
